# Patient Record
Sex: Female | Born: 1995 | Hispanic: Yes | Marital: Married | Smoking: Never smoker
Health system: Southern US, Community
[De-identification: ages and names within clinical notes are randomized; demographics above are authoritative.]

## PROBLEM LIST (undated history)

## (undated) DIAGNOSIS — Z789 Other specified health status: Secondary | ICD-10-CM

## (undated) HISTORY — DX: Other specified health status: Z78.9

---

## 2017-12-17 DIAGNOSIS — Z118 Encounter for screening for other infectious and parasitic diseases: Secondary | ICD-10-CM | POA: Diagnosis not present

## 2017-12-17 DIAGNOSIS — F418 Other specified anxiety disorders: Secondary | ICD-10-CM | POA: Diagnosis not present

## 2017-12-17 DIAGNOSIS — Z3200 Encounter for pregnancy test, result unknown: Secondary | ICD-10-CM | POA: Diagnosis not present

## 2017-12-17 DIAGNOSIS — N760 Acute vaginitis: Secondary | ICD-10-CM | POA: Diagnosis not present

## 2017-12-19 ENCOUNTER — Emergency Department (HOSPITAL_COMMUNITY)
Admission: EM | Admit: 2017-12-19 | Discharge: 2017-12-20 | Disposition: A | Discharge status: Home / Self Care | Payer: BLUE CROSS/BLUE SHIELD | Attending: Emergency Medicine | Admitting: Emergency Medicine

## 2017-12-19 ENCOUNTER — Other Ambulatory Visit: Payer: Self-pay

## 2017-12-19 ENCOUNTER — Encounter (HOSPITAL_COMMUNITY): Payer: Self-pay | Admitting: *Deleted

## 2017-12-19 DIAGNOSIS — Z79899 Other long term (current) drug therapy: Secondary | ICD-10-CM | POA: Diagnosis not present

## 2017-12-19 DIAGNOSIS — R51 Headache: Secondary | ICD-10-CM | POA: Insufficient documentation

## 2017-12-19 DIAGNOSIS — R1110 Vomiting, unspecified: Secondary | ICD-10-CM | POA: Diagnosis present

## 2017-12-19 DIAGNOSIS — R519 Headache, unspecified: Secondary | ICD-10-CM

## 2017-12-19 DIAGNOSIS — R112 Nausea with vomiting, unspecified: Secondary | ICD-10-CM

## 2017-12-19 LAB — COMPREHENSIVE METABOLIC PANEL
ALT: 13 U/L (ref 0–44)
AST: 22 U/L (ref 15–41)
Albumin: 4.3 g/dL (ref 3.5–5.0)
Alkaline Phosphatase: 69 U/L (ref 38–126)
Anion gap: 12 (ref 5–15)
BILIRUBIN TOTAL: 0.5 mg/dL (ref 0.3–1.2)
BUN: 11 mg/dL (ref 6–20)
CHLORIDE: 106 mmol/L (ref 98–111)
CO2: 22 mmol/L (ref 22–32)
Calcium: 9.3 mg/dL (ref 8.9–10.3)
Creatinine, Ser: 0.74 mg/dL (ref 0.44–1.00)
Glucose, Bld: 119 mg/dL — ABNORMAL HIGH (ref 70–99)
POTASSIUM: 3 mmol/L — AB (ref 3.5–5.1)
Sodium: 140 mmol/L (ref 135–145)
TOTAL PROTEIN: 7.3 g/dL (ref 6.5–8.1)

## 2017-12-19 LAB — URINALYSIS, ROUTINE W REFLEX MICROSCOPIC
BILIRUBIN URINE: NEGATIVE
Glucose, UA: NEGATIVE mg/dL
KETONES UR: 5 mg/dL — AB
LEUKOCYTES UA: NEGATIVE
NITRITE: NEGATIVE
PROTEIN: NEGATIVE mg/dL
Specific Gravity, Urine: 1.011 (ref 1.005–1.030)
pH: 7 (ref 5.0–8.0)

## 2017-12-19 LAB — CBC
HEMATOCRIT: 39.2 % (ref 36.0–46.0)
Hemoglobin: 13.5 g/dL (ref 12.0–15.0)
MCH: 30.9 pg (ref 26.0–34.0)
MCHC: 34.4 g/dL (ref 30.0–36.0)
MCV: 89.7 fL (ref 78.0–100.0)
PLATELETS: 265 10*3/uL (ref 150–400)
RBC: 4.37 MIL/uL (ref 3.87–5.11)
RDW: 11.8 % (ref 11.5–15.5)
WBC: 9 10*3/uL (ref 4.0–10.5)

## 2017-12-19 LAB — I-STAT BETA HCG BLOOD, ED (MC, WL, AP ONLY): I-stat hCG, quantitative: <5 m[IU]/mL (ref <5)

## 2017-12-19 LAB — LIPASE, BLOOD: LIPASE: 26 U/L (ref 11–51)

## 2017-12-19 MED ORDER — ONDANSETRON 4 MG PO TBDP
4.0000 mg | ORAL_TABLET | Freq: Once | ORAL | Status: AC
  Administered 2017-12-19: 4 mg via ORAL
  Filled 2017-12-19: qty 1

## 2017-12-19 MED ORDER — ONDANSETRON 4 MG PO TBDP: 4.0000 mg | ORAL_TABLET | Freq: Three times a day (TID) | ORAL | 0 refills | Status: DC | PRN

## 2017-12-19 MED ORDER — KETOROLAC TROMETHAMINE 15 MG/ML IJ SOLN
15.0000 mg | Freq: Once | INTRAMUSCULAR | Status: AC
  Administered 2017-12-19: 15 mg via INTRAVENOUS
  Filled 2017-12-19: qty 1

## 2017-12-19 MED ORDER — DEXAMETHASONE SODIUM PHOSPHATE 10 MG/ML IJ SOLN
10.0000 mg | Freq: Once | INTRAMUSCULAR | Status: AC
  Administered 2017-12-19: 10 mg via INTRAVENOUS
  Filled 2017-12-19: qty 1

## 2017-12-19 MED ORDER — DIPHENHYDRAMINE HCL 50 MG/ML IJ SOLN
25.0000 mg | Freq: Once | INTRAMUSCULAR | Status: AC
  Administered 2017-12-19: 25 mg via INTRAVENOUS
  Filled 2017-12-19: qty 1

## 2017-12-19 MED ORDER — SODIUM CHLORIDE 0.9 % IV BOLUS
1000.0000 mL | Freq: Once | INTRAVENOUS | Status: AC
  Administered 2017-12-19: 1000 mL via INTRAVENOUS

## 2017-12-19 MED ORDER — POTASSIUM CHLORIDE CRYS ER 20 MEQ PO TBCR
40.0000 meq | EXTENDED_RELEASE_TABLET | Freq: Once | ORAL | Status: AC
  Administered 2017-12-19: 40 meq via ORAL
  Filled 2017-12-19: qty 2

## 2017-12-19 MED ORDER — PROCHLORPERAZINE EDISYLATE 10 MG/2ML IJ SOLN
10.0000 mg | Freq: Once | INTRAMUSCULAR | Status: AC
  Administered 2017-12-19: 10 mg via INTRAVENOUS
  Filled 2017-12-19: qty 2

## 2017-12-19 NOTE — ED Triage Notes (Signed)
Pt arrives with sudden onset of vomiting about an hour ago, denies abdominal pain. She does have a headache. Reports she is on day 3 of antibiotics for bacterial vaginosis.

## 2017-12-19 NOTE — ED Provider Notes (Signed)
[facility]-EMERGENCY DEPT Provider Note   CSN: [id number] Arrival date & time: 12/19/17  1901     History   Chief Complaint Chief Complaint  Patient presents with  . Emesis    HPI Colleen Phelps is a 22 y.o. female presents today for evaluation of gradual onset, progressively worsening headache at around 3 PM this afternoon.  She states that she woke up from a nap at around 3 PM with throbbing aching frontal headache which has progressively worsened as the day has gone on.  She notes associated photophobia and phonophobia and 2 to 3 hours ago began developing nausea and vomiting.  She states that she has been vomiting at least once every 15 minutes for the past few hours.  Emesis is nonbloody and nonbilious.  She denies abdominal pain, diarrhea, constipation, melena, hematochezia, or urinary symptoms.  Currently on day 3 of Flagyl for BV, has taken 5 doses.  Denies drinking alcohol.  No suspicious food intake.  Denies fevers, chest pain, shortness of breath, vision changes, numbness, weakness, slurred speech, or syncope.  She states this feels similar to migraines that she has had in the past although those typically are not associated with vomiting.  No recent trauma or falls.  The history is provided by the patient.    History reviewed. No pertinent past medical history.  There are no active problems to display for this patient.   History reviewed. No pertinent surgical history.   OB History   None      Home Medications    Prior to Admission medications   Medication Sig Start Date End Date Taking? Authorizing Provider  metroNIDAZOLE (FLAGYL) 500 MG tablet Take 500 mg by mouth 2 (two) times daily.   Yes [provider]  sertraline (ZOLOFT) 25 MG tablet Take 25 mg by mouth daily.   Yes [provider]  ondansetron (ZOFRAN ODT) 4 MG disintegrating tablet Take 1 tablet (4 mg total) by mouth every 8 (eight) hours as needed for nausea or  vomiting. 12/19/17   Jeanie Sewer, PA-C    Family History No family history on file.  Social History Social History   Tobacco Use  . Smoking status: Never Smoker  . Smokeless tobacco: Never Used  Substance Use Topics  . Alcohol use: Yes  . Drug use: Never     Allergies   Patient has no known allergies.   Review of Systems Review of Systems  Constitutional: Negative for chills and fever.  Eyes: Positive for photophobia. Negative for visual disturbance.  Respiratory: Negative for shortness of breath.   Cardiovascular: Negative for chest pain.  Gastrointestinal: Positive for nausea and vomiting. Negative for abdominal pain, constipation and diarrhea.  Genitourinary: Negative for vaginal bleeding, vaginal discharge and vaginal pain.  Neurological: Positive for headaches. Negative for syncope, weakness and numbness.  All other systems reviewed and are negative.    Physical Exam Updated Vital Signs BP 121/74 (BP Location: Left Arm)   Pulse 68   Temp 98 F (36.7 C) (Oral)   Resp 16   Ht 5\' 3"  (1.6 m)   Wt 59.4 kg   LMP 12/16/2017   SpO2 100%   BMI 23.21 kg/m   Physical Exam  Constitutional: She is oriented to person, place, and time. She appears well-developed and well-nourished. No distress.  HENT:  Head: Normocephalic and atraumatic.  Eyes: Conjunctivae are normal. Right eye exhibits no discharge. Left eye exhibits no discharge.  Neck: Normal range of motion. Neck  supple. No JVD present. No tracheal deviation present.  Cardiovascular: Normal rate, regular rhythm, normal heart sounds and intact distal pulses.  Pulmonary/Chest: Effort normal.  Abdominal: Soft. Bowel sounds are normal. She exhibits no distension. There is no tenderness. There is no guarding.  Musculoskeletal: She exhibits no edema.  Neurological: She is alert and oriented to person, place, and time. No cranial nerve deficit or sensory deficit. She exhibits normal muscle tone.  Mental Status:    Alert, thought content appropriate, able to give a coherent history. Speech fluent without evidence of aphasia. Able to follow 2 step commands without difficulty.  Cranial Nerves:  II:  Peripheral visual fields grossly normal, pupils equal, round, reactive to light III,IV, VI: ptosis not present, extra-ocular motions intact bilaterally  V,VII: smile symmetric, facial light touch sensation equal VIII: hearing grossly normal to voice  X: uvula elevates symmetrically  XI: bilateral shoulder shrug symmetric and strong XII: midline tongue extension without fassiculations Motor:  Normal tone. 5/5 strength of BUE and BLE major muscle groups including strong and equal grip strength and dorsiflexion/plantar flexion Sensory: light touch normal in all extremities. Cerebellar: normal finger-to-nose with bilateral upper extremities Gait: normal gait and balance. Able to walk on toes and heels with ease.     Skin: Skin is warm and dry. No erythema.  Psychiatric: She has a normal mood and affect. Her behavior is normal.  Nursing note and vitals reviewed.    ED Treatments / Results  Labs (all labs ordered are listed, but only abnormal results are displayed) Labs Reviewed  COMPREHENSIVE METABOLIC PANEL - Abnormal; Notable for the following components:      Result Value   Potassium 3.0 (*)    Glucose, Bld 119 (*)    All other components within normal limits  URINALYSIS, ROUTINE W REFLEX MICROSCOPIC - Abnormal; Notable for the following components:   Hgb urine dipstick MODERATE (*)    Ketones, ur 5 (*)    Bacteria, UA RARE (*)    All other components within normal limits  LIPASE, BLOOD  CBC  I-STAT BETA HCG BLOOD, ED ([facility], [facility], [facility] ONLY)    EKG None  Radiology No results found.  Procedures Procedures (including critical care time)  Medications Ordered in ED Medications  sodium chloride 0.9 % bolus 1,000 mL (0 mLs Intravenous Stopped 12/19/17 2324)  ondansetron (ZOFRAN-ODT)  disintegrating tablet 4 mg (4 mg Oral Given 12/19/17 2138)  ketorolac (TORADOL) 15 MG/ML injection 15 mg (15 mg Intravenous Given 12/19/17 2240)  prochlorperazine (COMPAZINE) injection 10 mg (10 mg Intravenous Given 12/19/17 2240)  dexamethasone (DECADRON) injection 10 mg (10 mg Intravenous Given 12/19/17 2240)  diphenhydrAMINE (BENADRYL) injection 25 mg (25 mg Intravenous Given 12/19/17 2240)  potassium chloride SA (K-DUR,KLOR-CON) CR tablet 40 mEq (40 mEq Oral Given 12/19/17 2329)     Initial Impression / Assessment and Plan / ED Course  I have reviewed the triage vital signs and the nursing notes.  Pertinent labs & imaging results that were available during my care of the patient were reviewed by me and considered in my medical decision making (see chart for details).     Patient presents for evaluation of gradual onset of frontal headache with associated nausea and vomiting.  Does have a history of migraines and states this feels similar although the vomiting is new.  She has been on Flagyl for 3 days but denies drinking any alcohol.  No focal neurologic deficits, normal neuro exam.  No red flag signs concerning for  SAH, ICH, CVA, or other acute life-threatening intracranial abnormality.  Abdomen is soft and nontender.  Lab work reviewed by me shows hypokalemia likely secondary to persistent vomiting.  Will replenish orally.  UA is not concerning for UTI or nephrolithiasis.  Remainder of lab work is unremarkable.  Doubt obstruction, perforation, appendicitis, colitis, TOA, ectopic pregnancy, PID, ovarian torsion, or other acute surgical abdominal pathology.  We will give migraine cocktail and reassess.  On reevaluation, patient states she is feeling much better.  Rates pain as a 4/10 in severity compared to 8/10 in severity on initial presentation.  Serial abdominal examinations remain benign and she is tolerating p.o. fluids in the ED without difficulty.  She feels comfortable with discharge home.   Recommend follow-up with PCP for reevaluation.  Discussed strict ED return precautions.  Will discharge with Zofran.  Patient and patient's husband verbalized understanding of and agreement with plan and patient is stable for discharge home at this time.  Final Clinical Impressions(s) / ED Diagnoses   Final diagnoses:  Bad headache  Non-intractable vomiting with nausea, unspecified vomiting type    ED Discharge Orders         Ordered    ondansetron (ZOFRAN ODT) 4 MG disintegrating tablet  Every 8 hours PRN     12/19/17 2357           Jeanie Sewer, PA-C 12/21/17 0143    Benjiman Core, MD 12/21/17 2316

## 2017-12-19 NOTE — Discharge Instructions (Addendum)
Alternate 600 mg of ibuprofen and [phone] mg of Tylenol every 3 hours as needed for pain. Do not exceed 4000 mg of Tylenol daily.  Take ibuprofen with food to avoid upset stomach issues.  Drink plenty of water.  Take Zofran as needed for nausea.  Wait around 20 to 30 minutes before having anything to eat or drink to give this medicine time to work.  This medicine will dissolve under your tongue.  Stop taking the Flagyl if your symptoms persist.  Do not drink any alcohol while taking Flagyl.  Follow-up with your PCP or OB/GYN for reevaluation.  Return to the emergency department if any concerning signs or symptoms develop such as fever, worsening headache, persistent vomiting, weakness especially to one side of the body, slurred speech, or passing out.

## 2018-01-27 DIAGNOSIS — Z3201 Encounter for pregnancy test, result positive: Secondary | ICD-10-CM | POA: Diagnosis not present

## 2018-02-09 DIAGNOSIS — Z3201 Encounter for pregnancy test, result positive: Secondary | ICD-10-CM | POA: Diagnosis not present

## 2018-02-09 DIAGNOSIS — B373 Candidiasis of vulva and vagina: Secondary | ICD-10-CM | POA: Diagnosis not present

## 2018-03-25 DIAGNOSIS — Z6824 Body mass index (BMI) 24.0-24.9, adult: Secondary | ICD-10-CM | POA: Diagnosis not present

## 2018-03-25 DIAGNOSIS — R87610 Atypical squamous cells of undetermined significance on cytologic smear of cervix (ASC-US): Secondary | ICD-10-CM | POA: Diagnosis not present

## 2018-03-25 DIAGNOSIS — Z3480 Encounter for supervision of other normal pregnancy, unspecified trimester: Secondary | ICD-10-CM | POA: Diagnosis not present

## 2018-03-25 DIAGNOSIS — Z124 Encounter for screening for malignant neoplasm of cervix: Secondary | ICD-10-CM | POA: Diagnosis not present

## 2018-03-25 DIAGNOSIS — Z113 Encounter for screening for infections with a predominantly sexual mode of transmission: Secondary | ICD-10-CM | POA: Diagnosis not present

## 2018-03-25 DIAGNOSIS — O26849 Uterine size-date discrepancy, unspecified trimester: Secondary | ICD-10-CM | POA: Diagnosis not present

## 2018-03-25 DIAGNOSIS — Z01419 Encounter for gynecological examination (general) (routine) without abnormal findings: Secondary | ICD-10-CM | POA: Diagnosis not present

## 2018-03-25 LAB — OB RESULTS CONSOLE GC/CHLAMYDIA: Gonorrhea: NEGATIVE

## 2018-03-25 LAB — OB RESULTS CONSOLE HIV ANTIBODY (ROUTINE TESTING): HIV: NONREACTIVE

## 2018-03-25 LAB — OB RESULTS CONSOLE RUBELLA ANTIBODY, IGM: Rubella: IMMUNE

## 2018-03-25 LAB — OB RESULTS CONSOLE HEPATITIS B SURFACE ANTIGEN: Hepatitis B Surface Ag: NEGATIVE

## 2018-04-02 LAB — OB RESULTS CONSOLE GC/CHLAMYDIA: Chlamydia: NEGATIVE

## 2018-04-14 DIAGNOSIS — Z369 Encounter for antenatal screening, unspecified: Secondary | ICD-10-CM | POA: Diagnosis not present

## 2018-04-15 ENCOUNTER — Ambulatory Visit (HOSPITAL_COMMUNITY)
Admission: EM | Admit: 2018-04-15 | Discharge: 2018-04-15 | Disposition: A | Discharge status: Home / Self Care | Payer: BLUE CROSS/BLUE SHIELD | Attending: Family Medicine | Admitting: Family Medicine

## 2018-04-15 ENCOUNTER — Encounter (HOSPITAL_COMMUNITY): Payer: Self-pay

## 2018-04-15 DIAGNOSIS — R6889 Other general symptoms and signs: Secondary | ICD-10-CM | POA: Diagnosis not present

## 2018-04-15 MED ORDER — OSELTAMIVIR PHOSPHATE 75 MG PO CAPS: 75.0000 mg | ORAL_CAPSULE | Freq: Two times a day (BID) | ORAL | 0 refills | Status: DC

## 2018-04-15 NOTE — ED Triage Notes (Signed)
Pt presents with flu like symptoms; vomiting, generalized body aches, headache, and sore throat.

## 2018-04-15 NOTE — ED Provider Notes (Signed)
[facility]   [id number] 04/15/18 Arrival Time: 1316   CC: Flu like symptoms  SUBJECTIVE: History from: patient.  Colleen Phelps is a 23 y.o. female who presents with abrupt onset of nasal congestion, runny nose, sore throat, body aches, and vomiting x 1 episodes that began yesterday evening.  Admits to possible sick exposure.  Has tried OTC tylenol with minimal relief.  Symptoms are made worse at night.  Reports previous symptoms in the past.   Denies fever, chills, SOB, wheezing, chest pain, changes in bowel or bladder habits.    Received flu shot this year: yes.  ROS: As per HPI.  History reviewed. No pertinent past medical history. History reviewed. No pertinent surgical history. No Known Allergies No current facility-administered medications on file prior to encounter.    Current Outpatient Medications on File Prior to Encounter  Medication Sig Dispense Refill  . sertraline (ZOLOFT) 25 MG tablet Take 25 mg by mouth daily.     Social History   Socioeconomic History  . Marital status: Married    Spouse name: Not on file  . Number of children: Not on file  . Years of education: Not on file  . Highest education level: Not on file  Occupational History  . Not on file  Social Needs  . Financial resource strain: Not on file  . Food insecurity:    Worry: Not on file    Inability: Not on file  . Transportation needs:    Medical: Not on file    Non-medical: Not on file  Tobacco Use  . Smoking status: Never Smoker  . Smokeless tobacco: Never Used  Substance and Sexual Activity  . Alcohol use: Yes  . Drug use: Never  . Sexual activity: Not on file  Lifestyle  . Physical activity:    Days per week: Not on file    Minutes per session: Not on file  . Stress: Not on file  Relationships  . Social connections:    Talks on phone: Not on file    Gets together: Not on file    Attends religious service: Not on file    Active member of club or organization: Not  on file    Attends meetings of clubs or organizations: Not on file    Relationship status: Not on file  . Intimate partner violence:    Fear of current or ex partner: Not on file    Emotionally abused: Not on file    Physically abused: Not on file    Forced sexual activity: Not on file  Other Topics Concern  . Not on file  Social History Narrative  . Not on file   History reviewed. No pertinent family history.  OBJECTIVE:  Vitals:   04/15/18 1423  BP: 115/63  Pulse: 100  Resp: 20  Temp: 99 F (37.2 C)  TempSrc: Oral  SpO2: 100%     General appearance: alert; appears fatigued, but nontoxic; speaking in full sentences and tolerating own secretions HEENT: NCAT; Ears: EACs clear, TMs pearly gray; Eyes: PERRL.  EOM grossly intact. Nose: nares patent with minimal clear rhinorrhea, turbinates erythematous and swollen; Throat: oropharynx clear, tonsils non erythematous or enlarged, uvula midline  Neck: supple without LAD Lungs: unlabored respirations, symmetrical air entry; cough: absent; no respiratory distress; CTAB Heart: regular rate and rhythm.  Radial pulses 2+ symmetrical bilaterally Skin: warm and dry Psychological: alert and cooperative; normal mood and affect  ASSESSMENT & PLAN:  1. Flu-like symptoms  Meds ordered this encounter  Medications  . oseltamivir (TAMIFLU) 75 MG capsule    Sig: Take 1 capsule (75 mg total) by mouth every 12 (twelve) hours.    Dispense:  10 capsule    Refill:  0    Order Specific Question:   Supervising Provider    Answer:   Eustace Moore [[id number]]    Get plenty of rest and push fluids Tamiflu prescribed.  Take as directed and to completion Use OTC medications like tylenol as needed fever or pain Follow up with PCP if symptoms persist Return or go to ER if you have any new or worsening symptoms fever, chills, nausea, vomiting, chest pain, cough, shortness of breath, wheezing, abdominal pain, changes in bowel or bladder habits,  etc...  Reviewed expectations re: course of current medical issues. Questions answered. Outlined signs and symptoms indicating need for more acute intervention. Patient verbalized understanding. After Visit Summary given.         Rennis Harding, PA-C 04/15/18 [phone]

## 2018-04-15 NOTE — Discharge Instructions (Signed)
Get plenty of rest and push fluids Tamiflu prescribed.  Take as directed and to completion Use OTC medications like tylenol as needed fever or pain Follow up with PCP if symptoms persist Return or go to ER if you have any new or worsening symptoms fever, chills, nausea, vomiting, chest pain, cough, shortness of breath, wheezing, abdominal pain, changes in bowel or bladder habits, etc..Marland Kitchen

## 2018-05-15 DIAGNOSIS — Z363 Encounter for antenatal screening for malformations: Secondary | ICD-10-CM | POA: Diagnosis not present

## 2018-06-10 DIAGNOSIS — Z369 Encounter for antenatal screening, unspecified: Secondary | ICD-10-CM | POA: Diagnosis not present

## 2018-07-10 DIAGNOSIS — Z3480 Encounter for supervision of other normal pregnancy, unspecified trimester: Secondary | ICD-10-CM | POA: Diagnosis not present

## 2018-07-10 DIAGNOSIS — Z23 Encounter for immunization: Secondary | ICD-10-CM | POA: Diagnosis not present

## 2018-08-05 DIAGNOSIS — Z369 Encounter for antenatal screening, unspecified: Secondary | ICD-10-CM | POA: Diagnosis not present

## 2018-08-25 DIAGNOSIS — Z369 Encounter for antenatal screening, unspecified: Secondary | ICD-10-CM | POA: Diagnosis not present

## 2018-09-09 DIAGNOSIS — Z113 Encounter for screening for infections with a predominantly sexual mode of transmission: Secondary | ICD-10-CM | POA: Diagnosis not present

## 2018-09-09 DIAGNOSIS — Z369 Encounter for antenatal screening, unspecified: Secondary | ICD-10-CM | POA: Diagnosis not present

## 2018-09-09 DIAGNOSIS — Z3480 Encounter for supervision of other normal pregnancy, unspecified trimester: Secondary | ICD-10-CM | POA: Diagnosis not present

## 2018-09-09 LAB — OB RESULTS CONSOLE GBS: GBS: NEGATIVE

## 2018-09-15 DIAGNOSIS — Z369 Encounter for antenatal screening, unspecified: Secondary | ICD-10-CM | POA: Diagnosis not present

## 2018-09-23 DIAGNOSIS — Z369 Encounter for antenatal screening, unspecified: Secondary | ICD-10-CM | POA: Diagnosis not present

## 2018-09-30 DIAGNOSIS — Z369 Encounter for antenatal screening, unspecified: Secondary | ICD-10-CM | POA: Diagnosis not present

## 2018-10-06 ENCOUNTER — Encounter (HOSPITAL_COMMUNITY): Payer: Self-pay | Admitting: *Deleted

## 2018-10-06 ENCOUNTER — Other Ambulatory Visit: Payer: Self-pay

## 2018-10-06 ENCOUNTER — Inpatient Hospital Stay (HOSPITAL_COMMUNITY)
Admission: AD | Admit: 2018-10-06 | Discharge: 2018-10-11 | DRG: 788 | Disposition: A | Discharge status: Home / Self Care | Payer: BC Managed Care – PPO | Attending: Obstetrics and Gynecology | Admitting: Obstetrics and Gynecology

## 2018-10-06 DIAGNOSIS — E669 Obesity, unspecified: Secondary | ICD-10-CM | POA: Diagnosis present

## 2018-10-06 DIAGNOSIS — O328XX0 Maternal care for other malpresentation of fetus, not applicable or unspecified: Secondary | ICD-10-CM | POA: Diagnosis present

## 2018-10-06 DIAGNOSIS — O9902 Anemia complicating childbirth: Secondary | ICD-10-CM | POA: Diagnosis present

## 2018-10-06 DIAGNOSIS — D649 Anemia, unspecified: Secondary | ICD-10-CM | POA: Diagnosis present

## 2018-10-06 DIAGNOSIS — Z1159 Encounter for screening for other viral diseases: Secondary | ICD-10-CM

## 2018-10-06 DIAGNOSIS — Z3A40 40 weeks gestation of pregnancy: Secondary | ICD-10-CM | POA: Diagnosis not present

## 2018-10-06 DIAGNOSIS — O99214 Obesity complicating childbirth: Secondary | ICD-10-CM | POA: Diagnosis present

## 2018-10-06 DIAGNOSIS — O26893 Other specified pregnancy related conditions, third trimester: Secondary | ICD-10-CM | POA: Diagnosis present

## 2018-10-06 LAB — TYPE AND SCREEN
ABO/RH(D): O POS
Antibody Screen: NEGATIVE

## 2018-10-06 LAB — CBC
HCT: 35.2 % — ABNORMAL LOW (ref 36.0–46.0)
Hemoglobin: 11.7 g/dL — ABNORMAL LOW (ref 12.0–15.0)
MCH: 30.5 pg (ref 26.0–34.0)
MCHC: 33.2 g/dL (ref 30.0–36.0)
MCV: 91.7 fL (ref 80.0–100.0)
Platelets: 204 10*3/uL (ref 150–400)
RBC: 3.84 MIL/uL — ABNORMAL LOW (ref 3.87–5.11)
RDW: 13.6 % (ref 11.5–15.5)
WBC: 10.3 10*3/uL (ref 4.0–10.5)
nRBC: 0 % (ref 0.0–0.2)

## 2018-10-06 LAB — ABO/RH: ABO/RH(D): O POS

## 2018-10-06 LAB — SARS CORONAVIRUS 2 BY RT PCR (HOSPITAL ORDER, PERFORMED IN ~~LOC~~ HOSPITAL LAB): SARS Coronavirus 2: NEGATIVE

## 2018-10-06 MED ORDER — EPHEDRINE 5 MG/ML INJ: 10.0000 mg | INTRAVENOUS | Status: DC | PRN

## 2018-10-06 MED ORDER — OXYTOCIN BOLUS FROM INFUSION: 500.0000 mL | Freq: Once | INTRAVENOUS | Status: DC

## 2018-10-06 MED ORDER — PHENYLEPHRINE 40 MCG/ML (10ML) SYRINGE FOR IV PUSH (FOR BLOOD PRESSURE SUPPORT): 80.0000 ug | PREFILLED_SYRINGE | INTRAVENOUS | Status: DC | PRN

## 2018-10-06 MED ORDER — LIDOCAINE HCL (PF) 1 % IJ SOLN: 30.0000 mL | INTRAMUSCULAR | Status: DC | PRN

## 2018-10-06 MED ORDER — LACTATED RINGERS IV SOLN
500.0000 mL | Freq: Once | INTRAVENOUS | Status: AC
  Administered 2018-10-07: 500 mL via INTRAVENOUS

## 2018-10-06 MED ORDER — TERBUTALINE SULFATE 1 MG/ML IJ SOLN: 0.2500 mg | Freq: Once | INTRAMUSCULAR | Status: DC | PRN

## 2018-10-06 MED ORDER — OXYTOCIN 40 UNITS IN NORMAL SALINE INFUSION - SIMPLE MED
1.0000 m[IU]/min | INTRAVENOUS | Status: DC
  Administered 2018-10-06: 2 m[IU]/min via INTRAVENOUS
  Administered 2018-10-07: 10:00:00 8 m[IU]/min via INTRAVENOUS
  Administered 2018-10-07: 09:00:00 6 m[IU]/min via INTRAVENOUS

## 2018-10-06 MED ORDER — PHENYLEPHRINE 40 MCG/ML (10ML) SYRINGE FOR IV PUSH (FOR BLOOD PRESSURE SUPPORT)
80.0000 ug | PREFILLED_SYRINGE | INTRAVENOUS | Status: DC | PRN
  Filled 2018-10-06: qty 10

## 2018-10-06 MED ORDER — OXYCODONE-ACETAMINOPHEN 5-325 MG PO TABS: 2.0000 | ORAL_TABLET | ORAL | Status: DC | PRN

## 2018-10-06 MED ORDER — OXYCODONE-ACETAMINOPHEN 5-325 MG PO TABS: 1.0000 | ORAL_TABLET | ORAL | Status: DC | PRN

## 2018-10-06 MED ORDER — ACETAMINOPHEN 325 MG PO TABS: 650.0000 mg | ORAL_TABLET | ORAL | Status: DC | PRN

## 2018-10-06 MED ORDER — LACTATED RINGERS IV SOLN
500.0000 mL | INTRAVENOUS | Status: DC | PRN
  Administered 2018-10-07: 500 mL via INTRAVENOUS

## 2018-10-06 MED ORDER — OXYTOCIN 40 UNITS IN NORMAL SALINE INFUSION - SIMPLE MED
2.5000 [IU]/h | INTRAVENOUS | Status: DC
  Filled 2018-10-06: qty 1000

## 2018-10-06 MED ORDER — FLEET ENEMA 7-19 GM/118ML RE ENEM: 1.0000 | ENEMA | RECTAL | Status: DC | PRN

## 2018-10-06 MED ORDER — ONDANSETRON HCL 4 MG/2ML IJ SOLN: 4.0000 mg | Freq: Four times a day (QID) | INTRAMUSCULAR | Status: DC | PRN

## 2018-10-06 MED ORDER — DIPHENHYDRAMINE HCL 50 MG/ML IJ SOLN: 12.5000 mg | INTRAMUSCULAR | Status: DC | PRN

## 2018-10-06 MED ORDER — SOD CITRATE-CITRIC ACID 500-334 MG/5ML PO SOLN: 30.0000 mL | ORAL | Status: DC | PRN

## 2018-10-06 MED ORDER — FENTANYL-BUPIVACAINE-NACL 0.5-0.125-0.9 MG/250ML-% EP SOLN
12.0000 mL/h | EPIDURAL | Status: DC | PRN
  Filled 2018-10-06: qty 250

## 2018-10-06 MED ORDER — LACTATED RINGERS IV SOLN
INTRAVENOUS | Status: DC
  Administered 2018-10-06 – 2018-10-07 (×6): via INTRAVENOUS

## 2018-10-06 MED ORDER — FENTANYL CITRATE (PF) 100 MCG/2ML IJ SOLN
100.0000 ug | INTRAMUSCULAR | Status: DC | PRN
  Administered 2018-10-06 – 2018-10-07 (×5): 100 ug via INTRAVENOUS
  Filled 2018-10-06 (×5): qty 2

## 2018-10-06 NOTE — H&P (Signed)
Shawnte Winton is an 23 y.o. G26P0 [redacted]w[redacted]d female who presents to L&D in labor. Her PNC was uncomplicated. GBS-neg/OGTT-wnl/Did not have genetic testing  History reviewed. No pertinent past medical history.  History reviewed. No pertinent surgical history.  History reviewed. No pertinent family history. Social History:  reports that she has never smoked. She has never used smokeless tobacco. She reports current alcohol use. She reports that she does not use drugs.  Allergies: No Known Allergies  Medications Prior to Admission  Medication Sig Dispense Refill  . oseltamivir (TAMIFLU) 75 MG capsule Take 1 capsule (75 mg total) by mouth every 12 (twelve) hours. 10 capsule 0  . sertraline (ZOLOFT) 25 MG tablet Take 25 mg by mouth daily.         Blood pressure 127/63, pulse 89, temperature 98.2 F (36.8 C), temperature source Oral, resp. rate 18, height 5\' 3"  (1.6 m), weight 83.3 kg, last menstrual period 12/16/2017, SpO2 100 %. Abdomen: gravid   Lab Results  Component Value Date   WBC 10.3 10/06/2018   HGB 11.7 (L) 10/06/2018   HCT 35.2 (L) 10/06/2018   MCV 91.7 10/06/2018   PLT 204 10/06/2018   Lab Results  Component Value Date   HCG <5.0 12/19/2017      Patient Active Problem List   Diagnosis Date Noted  . Normal labor and delivery 10/06/2018   IMP/ IUP at term in labor Plan/ Admit  Olga Millers 10/06/2018, 4:27 PM

## 2018-10-06 NOTE — MAU Note (Signed)
Since 0330 this morning, has been having contractions. 5-78min, gotten stronger. No water leaking. Was 2 cm at last appt.  When she wipes, she sees pink d/c.

## 2018-10-07 ENCOUNTER — Inpatient Hospital Stay (HOSPITAL_COMMUNITY): Payer: BC Managed Care – PPO | Admitting: Anesthesiology

## 2018-10-07 ENCOUNTER — Encounter (HOSPITAL_COMMUNITY): Payer: Self-pay | Admitting: Anesthesiology

## 2018-10-07 ENCOUNTER — Encounter (HOSPITAL_COMMUNITY)
Admission: AD | Disposition: A | Discharge status: Home / Self Care | Payer: Self-pay | Source: Home / Self Care | Attending: Obstetrics and Gynecology

## 2018-10-07 LAB — RPR: RPR Ser Ql: NONREACTIVE

## 2018-10-07 SURGERY — Surgical Case
Anesthesia: Epidural | Wound class: Clean Contaminated

## 2018-10-07 MED ORDER — SERTRALINE HCL 25 MG PO TABS
25.0000 mg | ORAL_TABLET | Freq: Every day | ORAL | Status: DC
  Filled 2018-10-07 (×5): qty 1

## 2018-10-07 MED ORDER — OXYTOCIN 40 UNITS IN NORMAL SALINE INFUSION - SIMPLE MED
INTRAVENOUS | Status: AC
  Filled 2018-10-07: qty 1000

## 2018-10-07 MED ORDER — ONDANSETRON HCL 4 MG/2ML IJ SOLN
INTRAMUSCULAR | Status: AC
  Filled 2018-10-07: qty 2

## 2018-10-07 MED ORDER — COCONUT OIL OIL: 1.0000 "application " | TOPICAL_OIL | Status: DC | PRN

## 2018-10-07 MED ORDER — LIDOCAINE HCL (PF) 1 % IJ SOLN
INTRAMUSCULAR | Status: DC | PRN
  Administered 2018-10-07 (×2): 4 mL via EPIDURAL

## 2018-10-07 MED ORDER — SODIUM CHLORIDE 0.9 % IV SOLN
INTRAVENOUS | Status: DC | PRN
  Administered 2018-10-07: 12:00:00 via INTRAVENOUS

## 2018-10-07 MED ORDER — SENNOSIDES-DOCUSATE SODIUM 8.6-50 MG PO TABS
2.0000 | ORAL_TABLET | ORAL | Status: DC
  Administered 2018-10-08 – 2018-10-10 (×4): 2 via ORAL
  Filled 2018-10-07 (×4): qty 2

## 2018-10-07 MED ORDER — WITCH HAZEL-GLYCERIN EX PADS: 1.0000 "application " | MEDICATED_PAD | CUTANEOUS | Status: DC | PRN

## 2018-10-07 MED ORDER — SODIUM BICARBONATE 8.4 % IV SOLN
INTRAVENOUS | Status: DC | PRN
  Administered 2018-10-07: 3 mL via EPIDURAL
  Administered 2018-10-07: 7 mL via EPIDURAL

## 2018-10-07 MED ORDER — DIBUCAINE (PERIANAL) 1 % EX OINT: 1.0000 "application " | TOPICAL_OINTMENT | CUTANEOUS | Status: DC | PRN

## 2018-10-07 MED ORDER — IBUPROFEN 800 MG PO TABS
800.0000 mg | ORAL_TABLET | Freq: Three times a day (TID) | ORAL | Status: AC
  Administered 2018-10-07 – 2018-10-10 (×9): 800 mg via ORAL
  Filled 2018-10-07 (×9): qty 1

## 2018-10-07 MED ORDER — MORPHINE SULFATE (PF) 0.5 MG/ML IJ SOLN
INTRAMUSCULAR | Status: AC
  Filled 2018-10-07: qty 10

## 2018-10-07 MED ORDER — SIMETHICONE 80 MG PO CHEW
80.0000 mg | CHEWABLE_TABLET | ORAL | Status: DC
  Administered 2018-10-08 – 2018-10-10 (×4): 80 mg via ORAL
  Filled 2018-10-07 (×4): qty 1

## 2018-10-07 MED ORDER — DIPHENHYDRAMINE HCL 25 MG PO CAPS: 25.0000 mg | ORAL_CAPSULE | Freq: Four times a day (QID) | ORAL | Status: DC | PRN

## 2018-10-07 MED ORDER — PHENYLEPHRINE 40 MCG/ML (10ML) SYRINGE FOR IV PUSH (FOR BLOOD PRESSURE SUPPORT)
PREFILLED_SYRINGE | INTRAVENOUS | Status: AC
  Filled 2018-10-07: qty 10

## 2018-10-07 MED ORDER — FENTANYL CITRATE (PF) 100 MCG/2ML IJ SOLN
INTRAMUSCULAR | Status: AC
  Filled 2018-10-07: qty 2

## 2018-10-07 MED ORDER — FENTANYL CITRATE (PF) 100 MCG/2ML IJ SOLN: 25.0000 ug | INTRAMUSCULAR | Status: DC | PRN

## 2018-10-07 MED ORDER — ACETAMINOPHEN 325 MG PO TABS
650.0000 mg | ORAL_TABLET | ORAL | Status: DC | PRN
  Administered 2018-10-08 – 2018-10-11 (×7): 650 mg via ORAL
  Filled 2018-10-07 (×7): qty 2

## 2018-10-07 MED ORDER — OXYTOCIN 10 UNIT/ML IJ SOLN
INTRAMUSCULAR | Status: AC
  Filled 2018-10-07: qty 4

## 2018-10-07 MED ORDER — NALOXONE HCL 0.4 MG/ML IJ SOLN: 0.4000 mg | INTRAMUSCULAR | Status: DC | PRN

## 2018-10-07 MED ORDER — MEPERIDINE HCL 25 MG/ML IJ SOLN: 6.2500 mg | INTRAMUSCULAR | Status: DC | PRN

## 2018-10-07 MED ORDER — MORPHINE SULFATE (PF) 0.5 MG/ML IJ SOLN
INTRAMUSCULAR | Status: DC | PRN
  Administered 2018-10-07: 3 mg via EPIDURAL
  Administered 2018-10-07: 2 mg via INTRAVENOUS

## 2018-10-07 MED ORDER — PHENYLEPHRINE HCL (PRESSORS) 10 MG/ML IV SOLN
INTRAVENOUS | Status: DC | PRN
  Administered 2018-10-07 (×2): 40 ug via INTRAVENOUS

## 2018-10-07 MED ORDER — SODIUM CHLORIDE 0.9% FLUSH: 3.0000 mL | INTRAVENOUS | Status: DC | PRN

## 2018-10-07 MED ORDER — CEFAZOLIN SODIUM-DEXTROSE 2-4 GM/100ML-% IV SOLN
2.0000 g | Freq: Once | INTRAVENOUS | Status: AC
  Administered 2018-10-07: 2 g via INTRAVENOUS

## 2018-10-07 MED ORDER — LIDOCAINE-EPINEPHRINE (PF) 2 %-1:200000 IJ SOLN
INTRAMUSCULAR | Status: DC | PRN
  Administered 2018-10-07 (×2): 4 mL via EPIDURAL

## 2018-10-07 MED ORDER — SODIUM CHLORIDE 0.9 % IR SOLN
Status: DC | PRN
  Administered 2018-10-07: 1000 mL

## 2018-10-07 MED ORDER — SODIUM CHLORIDE 0.9 % IV SOLN
INTRAVENOUS | Status: DC | PRN
  Administered 2018-10-07: 40 [IU] via INTRAVENOUS

## 2018-10-07 MED ORDER — PHENYLEPHRINE 40 MCG/ML (10ML) SYRINGE FOR IV PUSH (FOR BLOOD PRESSURE SUPPORT)
80.0000 ug | PREFILLED_SYRINGE | Freq: Once | INTRAVENOUS | Status: AC
  Administered 2018-10-07: 08:00:00 80 ug via INTRAVENOUS

## 2018-10-07 MED ORDER — ZOLPIDEM TARTRATE 5 MG PO TABS: 5.0000 mg | ORAL_TABLET | Freq: Every evening | ORAL | Status: DC | PRN

## 2018-10-07 MED ORDER — ONDANSETRON HCL 4 MG/2ML IJ SOLN
INTRAMUSCULAR | Status: DC | PRN
  Administered 2018-10-07: 4 mg via INTRAVENOUS

## 2018-10-07 MED ORDER — LACTATED RINGERS IV SOLN: INTRAVENOUS | Status: DC

## 2018-10-07 MED ORDER — ONDANSETRON HCL 4 MG/2ML IJ SOLN: 4.0000 mg | Freq: Three times a day (TID) | INTRAMUSCULAR | Status: DC | PRN

## 2018-10-07 MED ORDER — CEFAZOLIN SODIUM-DEXTROSE 2-4 GM/100ML-% IV SOLN
INTRAVENOUS | Status: AC
  Filled 2018-10-07: qty 100

## 2018-10-07 MED ORDER — KETOROLAC TROMETHAMINE 30 MG/ML IJ SOLN
30.0000 mg | Freq: Four times a day (QID) | INTRAMUSCULAR | Status: AC | PRN
  Administered 2018-10-07: 30 mg via INTRAMUSCULAR

## 2018-10-07 MED ORDER — PRENATAL MULTIVITAMIN CH
1.0000 | ORAL_TABLET | Freq: Every day | ORAL | Status: DC
  Administered 2018-10-07 – 2018-10-11 (×5): 1 via ORAL
  Filled 2018-10-07 (×5): qty 1

## 2018-10-07 MED ORDER — FENTANYL CITRATE (PF) 100 MCG/2ML IJ SOLN
INTRAMUSCULAR | Status: DC | PRN
  Administered 2018-10-07: 100 ug via INTRAVENOUS

## 2018-10-07 MED ORDER — STERILE WATER FOR IRRIGATION IR SOLN
Status: DC | PRN
  Administered 2018-10-07: 1000 mL

## 2018-10-07 MED ORDER — SIMETHICONE 80 MG PO CHEW: 80.0000 mg | CHEWABLE_TABLET | ORAL | Status: DC | PRN

## 2018-10-07 MED ORDER — TETANUS-DIPHTH-ACELL PERTUSSIS 5-2.5-18.5 LF-MCG/0.5 IM SUSP: 0.5000 mL | Freq: Once | INTRAMUSCULAR | Status: DC

## 2018-10-07 MED ORDER — OXYCODONE-ACETAMINOPHEN 5-325 MG PO TABS: 1.0000 | ORAL_TABLET | ORAL | Status: DC | PRN

## 2018-10-07 MED ORDER — OXYTOCIN 40 UNITS IN NORMAL SALINE INFUSION - SIMPLE MED: 2.5000 [IU]/h | INTRAVENOUS | Status: AC

## 2018-10-07 MED ORDER — SODIUM BICARBONATE 8.4 % IV SOLN
INTRAVENOUS | Status: AC
  Filled 2018-10-07: qty 50

## 2018-10-07 MED ORDER — SIMETHICONE 80 MG PO CHEW
80.0000 mg | CHEWABLE_TABLET | Freq: Three times a day (TID) | ORAL | Status: DC
  Administered 2018-10-07 – 2018-10-11 (×12): 80 mg via ORAL
  Filled 2018-10-07 (×13): qty 1

## 2018-10-07 MED ORDER — KETOROLAC TROMETHAMINE 30 MG/ML IJ SOLN: 30.0000 mg | Freq: Four times a day (QID) | INTRAMUSCULAR | Status: AC | PRN

## 2018-10-07 MED ORDER — SODIUM CHLORIDE (PF) 0.9 % IJ SOLN
INTRAMUSCULAR | Status: DC | PRN
  Administered 2018-10-07: 12 mL/h via EPIDURAL

## 2018-10-07 MED ORDER — KETOROLAC TROMETHAMINE 30 MG/ML IJ SOLN
INTRAMUSCULAR | Status: AC
  Filled 2018-10-07: qty 1

## 2018-10-07 MED ORDER — LACTATED RINGERS IV SOLN: 500.0000 mL | Freq: Once | INTRAVENOUS | Status: DC

## 2018-10-07 MED ORDER — MENTHOL 3 MG MT LOZG: 1.0000 | LOZENGE | OROMUCOSAL | Status: DC | PRN

## 2018-10-07 SURGICAL SUPPLY — 33 items
BENZOIN TINCTURE PRP APPL 2/3 (GAUZE/BANDAGES/DRESSINGS) ×2 IMPLANT
CHLORAPREP W/TINT 26ML (MISCELLANEOUS) ×2 IMPLANT
CLAMP CORD UMBIL (MISCELLANEOUS) IMPLANT
CLOSURE STERI STRIP 1/2 X4 (GAUZE/BANDAGES/DRESSINGS) ×2 IMPLANT
CLOTH BEACON ORANGE TIMEOUT ST (SAFETY) ×2 IMPLANT
DRSG OPSITE POSTOP 4X10 (GAUZE/BANDAGES/DRESSINGS) ×2 IMPLANT
ELECT REM PT RETURN 9FT ADLT (ELECTROSURGICAL) ×2
ELECTRODE REM PT RTRN 9FT ADLT (ELECTROSURGICAL) ×1 IMPLANT
EXTRACTOR VACUUM M CUP 4 TUBE (SUCTIONS) IMPLANT
GLOVE BIOGEL PI IND STRL 6.5 (GLOVE) ×1 IMPLANT
GLOVE BIOGEL PI IND STRL 7.0 (GLOVE) ×1 IMPLANT
GLOVE BIOGEL PI INDICATOR 6.5 (GLOVE) ×1
GLOVE BIOGEL PI INDICATOR 7.0 (GLOVE) ×1
GLOVE ECLIPSE 6.5 STRL STRAW (GLOVE) ×2 IMPLANT
GOWN STRL REUS W/TWL LRG LVL3 (GOWN DISPOSABLE) ×4 IMPLANT
KIT ABG SYR 3ML LUER SLIP (SYRINGE) IMPLANT
NEEDLE HYPO 25X5/8 SAFETYGLIDE (NEEDLE) IMPLANT
NS IRRIG 1000ML POUR BTL (IV SOLUTION) ×2 IMPLANT
PACK C SECTION WH (CUSTOM PROCEDURE TRAY) ×2 IMPLANT
PAD ABD 7.5X8 STRL (GAUZE/BANDAGES/DRESSINGS) IMPLANT
PAD OB MATERNITY 4.3X12.25 (PERSONAL CARE ITEMS) ×2 IMPLANT
PENCIL SMOKE EVAC W/HOLSTER (ELECTROSURGICAL) ×2 IMPLANT
RTRCTR C-SECT PINK 25CM LRG (MISCELLANEOUS) ×2 IMPLANT
SUT MON AB 2-0 CT1 27 (SUTURE) ×2 IMPLANT
SUT MON AB 4-0 PS1 27 (SUTURE) IMPLANT
SUT PDS AB 0 CTX 60 (SUTURE) IMPLANT
SUT PLAIN 2 0 XLH (SUTURE) ×2 IMPLANT
SUT VIC AB 0 CTX 36 (SUTURE) ×4
SUT VIC AB 0 CTX36XBRD ANBCTRL (SUTURE) ×4 IMPLANT
SUT VIC AB 4-0 KS 27 (SUTURE) ×2 IMPLANT
TOWEL OR 17X24 6PK STRL BLUE (TOWEL DISPOSABLE) ×2 IMPLANT
TRAY FOLEY W/BAG SLVR 14FR LF (SET/KITS/TRAYS/PACK) ×2 IMPLANT
WATER STERILE IRR 1000ML POUR (IV SOLUTION) ×2 IMPLANT

## 2018-10-07 NOTE — Progress Notes (Signed)
Pt progressed to 7cm then had a protracted labor curve. Now with an anterior lip. Nursing service is concerned that baby is OP. Pt just received an epidural. Once pt has adequate pain relief will check and if OP consider manual rotation

## 2018-10-07 NOTE — Op Note (Signed)
NAME: Colleen Phelps, Colleen MEDICAL RECORD [id number] ACCOUNT [id number] DATE OF BIRTH:Jul 02, 1995 FACILITY: [facility] LOCATION: [facility] PHYSICIAN:Taelor Waymire Adah PerlM. Josephyne Tarter, DO  OPERATIVE REPORT  DATE OF PROCEDURE:  10/07/2018  PREOPERATIVE DIAGNOSIS:  Primary cesarean section, arrest of descent, suspected occiput posterior presentation, late decelerations.  POSTOPERATIVE DIAGNOSES:  Primary cesarean section, arrest of descent, confirmed occiput posterior presentation.  PROCEDURE:  Low transverse cesarean section.  SURGEON:  Philip AspenSidney Falecia Vannatter, DO  ANESTHESIA:  Epidural.  ESTIMATED BLOOD LOSS:  556 mL.  FINDINGS:  Female infant in OP presentation with asynclitism.  Apgars and weight not assigned at time of brief op note.  Normal bilateral ovaries and tubes.  SPECIMENS:  Cord blood attempted collection.  Also attempted collection for cord pH.  Unable to draw.  COMPLICATIONS:  None.  CONDITION:  Stable to PACU.  DESCRIPTION OF PROCEDURE:  The patient was taken to the operating room where epidural anesthesia was bolused and found to be adequate.  She was prepped and draped in the normal sterile fashion in dorsal supine position with a leftward tilt.  A scalpel  was used to make a Pfannenstiel skin incision which was carried down to underlying layer of fascia with Bovie cautery.  The scalpel was used to incise the fascia at the midline and extended laterally with Mayo scissors.  Kocher clamps were placed at the  superior aspect of the fascial incision.  Rectus muscles were dissected off bluntly and sharply.  Kocher clamps were then placed at the inferior aspect of the fascial incision.  Rectus muscles were dissected off bluntly and sharply.  Hemostat was used to  separate the rectus muscle superiorly, and with good visualization the peritoneum was entered bluntly.  Peritoneal incision was extended with Metzenbaum scissors laterally and further with gentle traction.  The abdomen and pelvis were surveyed  manually.   An PharmacologistAlexis self-retractor was placed.  The vesicouterine peritoneum was identified, tented, and entered sharply with Metzenbaum scissors.  The bladder flap was extended laterally with Metzenbaums, and the bladder flap developed digitally.  A scalpel was  used to make a low transverse cesarean incision, and the amniotic sac was entered sharply with the scalpel and meconium fluid noted.  The infant's head was located, flexed, and delivered without difficulty followed by the remainder of the infant's  shoulders and body.  The infant was bulb suctioned and dried while delayed cord clamping was performed at 1 minute.  The cord was clamped and cut, and the infant was handed off to awaiting neonatology.  Cord blood was collected.  External massage of the  uterus was performed to increase tone, and gentle traction placed on the umbilical cord to facilitate delivery of the placenta.  The uterus was then cleared of all clot and debris, and the uterine incision was closed with 0 Vicryl in a running locked  fashion followed by a second layer of vertical imbrication.  An additional figure-of-eight was placed at the right angle and medial to the left angle.  Excellent hemostasis was noted.  The Graybar Electriclexis self-retractor was removed, and the incision was  reexamined and still hemostatic.  Both ovaries and tubes were visualized and normal.  The peritoneum was reapproximated and closed with Monocryl in a running fashion followed by a running reapproximation of the lower aspect of the rectus muscles.  The  fascia was then reapproximated and closed with 0 Vicryl in a running fashion.  The subcutaneous tissue was irrigated and dried.  Minimal use of Bovie cautery was needed for hemostasis.  The  skin was then reapproximated and closed with Vicryl on a Keith  needle in a subcuticular fashion.  The patient tolerated the procedure well.  Sponge, lap and needle counts were correct x2.  The patient was taken to recovery in  stable condition.  LN/NUANCE  D:10/07/2018 T:10/07/2018 JOB:[id number]

## 2018-10-07 NOTE — Anesthesia Postprocedure Evaluation (Signed)
Anesthesia Post Note  Patient: Colleen Phelps  Procedure(s) Performed: CESAREAN SECTION (N/A )     Patient location during evaluation: PACU Anesthesia Type: Epidural Level of consciousness: oriented and awake and alert Pain management: pain level controlled Vital Signs Assessment: post-procedure vital signs reviewed and stable Respiratory status: spontaneous breathing, respiratory function stable and nonlabored ventilation Cardiovascular status: blood pressure returned to baseline and stable Postop Assessment: no headache, no backache, no apparent nausea or vomiting, epidural receding and patient able to bend at knees Anesthetic complications: no    Last Vitals:  Vitals:   10/07/18 1330 10/07/18 1345  BP: (!) 103/51 (!) 82/44  Pulse: 80 82  Resp: 15 19  Temp:    SpO2: 99% 100%    Last Pain:  Vitals:   10/07/18 1330  TempSrc:   PainSc: 3    Pain Goal:                Epidural/Spinal Function Cutaneous sensation: Tingles (10/07/18 1345), Patient able to flex knees: Yes (10/07/18 1345), Patient able to lift hips off bed: No (10/07/18 1345), Back pain beyond tenderness at insertion site: No (10/07/18 1345), Progressively worsening motor and/or sensory loss: No (10/07/18 1345), Bowel and/or bladder incontinence post epidural: No (10/07/18 1345)  Guhan Bruington A.

## 2018-10-07 NOTE — Brief Op Note (Signed)
10/06/2018 - 10/07/2018  12:23 PM  PATIENT:  Colleen Phelps  23 y.o. female  PRE-OPERATIVE DIAGNOSIS:  primary C/S; arrest of descent  POST-OPERATIVE DIAGNOSIS:  primary C/S; arrest of descent  PROCEDURE:  Procedure(s): CESAREAN SECTION (N/A)- low transverse  SURGEON:  Surgeon(s) and Role:    Rogue Bussing, Eldean Klatt, DO - Primary  ANESTHESIA:   epidural  EBL:  556 mL   FINDINGS: female infant OP presentation with asynclitism, APGARS and wt not yet assigned.  Normal bilateral ovaries and tubes.  SPECIMEN:  Source of Specimen:  attempted collection of cord blood for pH, unable to draw  DISPOSITION OF SPECIMEN:  N/A  COUNTS:  YES  PLAN OF CARE: Admit to inpatient   PATIENT DISPOSITION:  PACU - hemodynamically stable.   Delay start of Pharmacological VTE agent (>24hrs) due to surgical blood loss or risk of bleeding: not applicable

## 2018-10-07 NOTE — Anesthesia Preprocedure Evaluation (Addendum)
Anesthesia Evaluation  Patient identified by MRN, date of birth, ID band Patient awake    Reviewed: Allergy & Precautions, NPO status , Patient's Chart, lab work & pertinent test results  Airway Mallampati: II  TM Distance: >3 FB Neck ROM: Full    Dental no notable dental hx. (+) Teeth Intact   Pulmonary neg pulmonary ROS,    Pulmonary exam normal breath sounds clear to auscultation       Cardiovascular negative cardio ROS Normal cardiovascular exam Rhythm:Regular Rate:Normal     Neuro/Psych negative neurological ROS  negative psych ROS   GI/Hepatic negative GI ROS, Neg liver ROS,   Endo/Other  Obesity  Renal/GU negative Renal ROS  negative genitourinary   Musculoskeletal negative musculoskeletal ROS (+)   Abdominal (+) + obese,   Peds  Hematology  (+) anemia ,   Anesthesia Other Findings   Reproductive/Obstetrics (+) Pregnancy                            Anesthesia Physical Anesthesia Plan  ASA: II and emergent  Anesthesia Plan: Epidural   Post-op Pain Management:    Induction:   PONV Risk Score and Plan:   Airway Management Planned: Natural Airway  Additional Equipment:   Intra-op Plan:   Post-operative Plan:   Informed Consent: I have reviewed the patients History and Physical, chart, labs and discussed the procedure including the risks, benefits and alternatives for the proposed anesthesia with the patient or authorized representative who has indicated his/her understanding and acceptance.       Plan Discussed with: Anesthesiologist, CRNA and Surgeon  Anesthesia Plan Comments: (Patient for C/Section for arrest of descent. Will use epidural for C/section. M. Royce Macadamia, MD)       Anesthesia Quick Evaluation

## 2018-10-07 NOTE — Transfer of Care (Signed)
Immediate Anesthesia Transfer of Care Note  Patient: Hilde Churchman  Procedure(s) Performed: CESAREAN SECTION (N/A )  Patient Location: PACU  Anesthesia Type:Epidural  Level of Consciousness: awake, alert  and oriented  Airway & Oxygen Therapy: Patient Spontanous Breathing  Post-op Assessment: Report given to RN and Post -op Vital signs reviewed and stable  Post vital signs: Reviewed and stable  Last Vitals:  Vitals Value Taken Time  BP 99/53 10/07/18 1236  Temp    Pulse 88 10/07/18 1242  Resp 24 10/07/18 1242  SpO2 100 % 10/07/18 1242  Vitals shown include unvalidated device data.  Last Pain:  Vitals:   10/07/18 1102  TempSrc: Oral  PainSc:          Complications: No apparent anesthesia complications

## 2018-10-07 NOTE — Progress Notes (Signed)
Pt comfortable with epidural.  Has been pushing 2 hours, I assessed at beginning and now reassessed an minimal progress with fetal head behind pubic bone, good maternal effort, suspect OP or asynclitism.  Good FHT variability but after d/c of pushing late decelerations again noted.  Pt counseled about option of continued pushing and risks associated if needing to cesarean section.  Discussed vacuum assisted delivery, I don't believe she is a good candidate given significant caput and not of sufficient station- currently 0/+1.  Discussed risks, benefits, potential complications and implication for future pregnancies.  I recommend proceeding to cesarean section and pt agrees, consent signed.

## 2018-10-07 NOTE — Progress Notes (Signed)
MOB was referred for history of depression/anxiety. * Referral screened out by Clinical Social Worker because none of the following criteria appear to apply: ~ History of anxiety/depression during this pregnancy, or of post-partum depression following prior delivery. ~ Diagnosis of anxiety and/or depression within last 3 years OR * MOB's symptoms currently being treated with medication and/or therapy. Per MOB's records, MOB on Zoloft for anxiety/depression.    Please contact the Clinical Social Worker if needs arise, by [facility] request, or if MOB scores greater than 9/yes to question 10 on Edinburgh Postpartum Depression Screen.       Virgie Dad Cesareo Vickrey, MSW, LCSW-A [facility] [place] [facility] [place] [phone]

## 2018-10-07 NOTE — Progress Notes (Signed)
Pt comfortable s/p epidural.  RN alerted me to repeat late decels after epidural placed.  Reviewed strip from overnight and there were prior episodes of late decelerations that resolved. Restarted with epidural placement.  BP somewhat low, advised RN to administer phenylephrine and stop pitocin.  Decelerations improved but did not resolved, good variability present and RN able to elicit scalp stim.  I presented to check pt, found to be complete and + scalp stim again. Pitocin restarted at 1/2 last dose. Did trial push and baby tolerated well with no late decel, more of variable appearance with moderate variability and normal baseline.  Pt pushed well and will continue to monitor.

## 2018-10-07 NOTE — Anesthesia Procedure Notes (Signed)
Epidural Patient location during procedure: OB Start time: 10/07/2018 7:06 AM End time: 10/07/2018 7:15 AM  Staffing Anesthesiologist: Josephine Igo, MD Performed: anesthesiologist   Preanesthetic Checklist Completed: patient identified, site marked, surgical consent, pre-op evaluation, timeout performed, IV checked, risks and benefits discussed and monitors and equipment checked  Epidural Patient position: sitting Prep: site prepped and draped and DuraPrep Patient monitoring: continuous pulse ox and blood pressure Approach: midline Location: L3-L4 Injection technique: LOR air  Needle:  Needle type: Tuohy  Needle gauge: 17 G Needle length: 9 cm and 9 Needle insertion depth: 4 cm Catheter type: closed end flexible Catheter size: 19 Gauge Catheter at skin depth: 9 cm Test dose: negative and Other  Assessment Events: blood not aspirated, injection not painful, no injection resistance, negative IV test and no paresthesia  Additional Notes Patient identified. Risks and benefits discussed including failed block, incomplete  Pain control, post dural puncture headache, nerve damage, paralysis, blood pressure Changes, nausea, vomiting, reactions to medications-both toxic and allergic and post Partum back pain. All questions were answered. Patient expressed understanding and wished to proceed. Sterile technique was used throughout procedure. Epidural site was Dressed with sterile barrier dressing. No paresthesias, signs of intravascular injection Or signs of intrathecal spread were encountered.  Patient was more comfortable after the epidural was dosed. Please see RN's note for documentation of vital signs and FHR which are stable. Reason for block:procedure for pain

## 2018-10-08 LAB — CBC
HCT: 24.9 % — ABNORMAL LOW (ref 36.0–46.0)
Hemoglobin: 8.3 g/dL — ABNORMAL LOW (ref 12.0–15.0)
MCH: 30.7 pg (ref 26.0–34.0)
MCHC: 33.3 g/dL (ref 30.0–36.0)
MCV: 92.2 fL (ref 80.0–100.0)
Platelets: 163 10*3/uL (ref 150–400)
RBC: 2.7 MIL/uL — ABNORMAL LOW (ref 3.87–5.11)
RDW: 14 % (ref 11.5–15.5)
WBC: 14.9 10*3/uL — ABNORMAL HIGH (ref 4.0–10.5)
nRBC: 0 % (ref 0.0–0.2)

## 2018-10-08 LAB — BIRTH TISSUE RECOVERY COLLECTION (PLACENTA DONATION)

## 2018-10-08 MED ORDER — OXYCODONE HCL 5 MG PO TABS
5.0000 mg | ORAL_TABLET | ORAL | Status: DC | PRN
  Administered 2018-10-08 – 2018-10-11 (×9): 5 mg via ORAL
  Filled 2018-10-08 (×9): qty 1

## 2018-10-08 NOTE — Lactation Note (Signed)
This note was copied from a baby's chart. Lactation Consultation Note Baby 58 hrs old.  Mom holding baby STS. Baby has been spitting. Newborn behavior, feeding habits, STS, I&O, breast massage, supply and demand discussed. Mom has tubular breast, compressible flat nipples that evert w/stimulation. Demonstrated finger stimulation before latching. Hand pump given to pre-pump if needed. Shells given to wear if breast tissue gets edematous and needs nipples everted. Hand expression demonstrated w/drops of colostrum collected for mom to give baby to stimulate feeding. Mom states she has a nice DEBP at home. Encouraged mom to call for assistance for feeding if needed. Lactation brochure given.  Patient Name: [name] [id number] Date: 10/08/2018 Reason for consult: Initial assessment;1st time breastfeeding;Term   Maternal Data Has patient been taught Hand Expression?: Yes Does the patient have breastfeeding experience prior to this delivery?: No  Feeding    LATCH Score Latch: Too sleepy or reluctant, no latch achieved, no sucking elicited.     Type of Nipple: Flat  Comfort (Breast/Nipple): Soft / non-tender        Interventions Interventions: Breast feeding basics reviewed;Skin to skin;Breast massage;Expressed milk;Hand express;Pre-pump if needed;Shells;Breast compression;Hand pump  Lactation Tools Discussed/Used Tools: Shells;Pump Shell Type: Inverted Breast pump type: Manual WIC Program: No Pump Review: Setup, frequency, and cleaning;Milk Storage Initiated by:: Allayne Stack RN IBCLC Date initiated:: 10/08/18   Consult Status Consult Status: Follow-up Date: 10/08/18 Follow-up type: In-patient    Daud Cayer, [place] 10/08/2018, 1:58 AM

## 2018-10-08 NOTE — Progress Notes (Addendum)
Patient is doing well.  She is tolerating PO, ambulating.  Foley catheter has been removed and has voided.  Pain is controlled.  Lochia is appropriate  Vitals:   10/08/18 0228 10/08/18 0331 10/08/18 0624 10/08/18 1010  BP: 101/63 92/63 (!) 100/55 98/68  Pulse: 93 (!) 105 92 91  Resp:   18 18  Temp:   98.5 F (36.9 C) 98.2 F (36.8 C)  TempSrc:   Oral Oral  SpO2:   99% 99%  Weight:      Height:        NAD Abdomen:  soft, appropriate tenderness, pressure dressing in place ext:    Symmetric, no edema bilaterally  Lab Results  Component Value Date   WBC 14.9 (H) 10/08/2018   HGB 8.3 (L) 10/08/2018   HCT 24.9 (L) 10/08/2018   MCV 92.2 10/08/2018   PLT 163 10/08/2018    --/--/O POS, O POS Performed at [facility] [address] [place], [place] [address]  (06/23 1400)/RImmune  A/P    23 y.o. G1P1001 POD 1 s/p primary cesarean section for arrest of descent Routine post op and postpartum care.   Remove pressure dressing after shower this AM--nurse alerted Baby in NICU for poor feeding effort and tachypnea

## 2018-10-08 NOTE — Clinical Social Work Maternal (Signed)
CLINICAL SOCIAL WORK MATERNAL/CHILD NOTE  Patient Details  Name: Colleen Phelps MRN: [id number] Date of Birth: July 08, 1995  Date:  10/08/2018  Clinical Social Worker Initiating Note:  Abundio Miu, [place] Date/Time: Initiated:  10/08/18/1041     Child's Name:  Colleen Phelps   Biological Parents:  Mother, Father(Father: Colleen Phelps)   Need for Interpreter:  None   Reason for Referral:  Other (Comment)(NICU Admission;; Hx of Depression and Anxiety)   Address:  [place] [address]    Phone number:  [phone] (home)     Additional phone number:   Household Members/Support Persons (HM/SP):   Household Member/Support Person 1   HM/SP Name Relationship DOB or Age  HM/SP -1 Colleen Verner Husband 09/24/1991  HM/SP -2        HM/SP -3        HM/SP -4        HM/SP -5        HM/SP -6        HM/SP -7        HM/SP -8          Natural Supports (not living in the home):  Other (Comment)(In-laws)   Professional Supports: None   Employment: Part-time   Type of Work: Lawyer   Education:  [place] arranged:    Museum/gallery curator Resources:  Multimedia programmer   Other Resources:      Cultural/Religious Considerations Which May Impact Care:    Strengths:  Ability to meet basic needs , Home prepared for child , Engineer, materials, Understanding of illness   Psychotropic Medications:         Pediatrician:    [place]olic[place] area  Pediatrician List:   [facility]      Pediatrician Fax Number:    Risk Factors/Current Problems:  None   Cognitive State:  Alert , Goal Oriented , Insightful , Linear Thinking , Able to Concentrate    Mood/Affect:  Interested , Relaxed , Calm    CSW Assessment: CSW met with MOB at bedside to discuss infant's NICU admission and history of  depression/anxiety. CSW introduced self and explained reason for consult. MOB was welcoming and engaged during assessment. 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 MOB reported that she has all items needed for infant including a car seat and basinet. MOB reported that she is from [place] and had a baby shower there right before the [place] 19 pandemic started. CSW inquired about MOB's support system, MOB reported that her husband and in-laws are her support system.  CSW inquired about MOB's mental health history, MOB denied any mental health history. MOB denied diagnosis of Anxiety and Depression. MOB reported that she had a lot going on last year with a big move, school and work. MOB reported that she was feeling down before learning about pregnancy and after learning about pregnancy everything got better. MOB presented calm and did not demonstrate any acute mental health signs/symptoms. CSW assessed for safety, MOB denied SI, HI and domestic violence.   CSW provided education regarding the baby blues period vs. perinatal mood disorders, discussed treatment and gave resources for mental health follow up if concerns arise.  CSW recommends self-evaluation during the postpartum time period using the New Mom Checklist from  Postpartum Progress and encouraged MOB to contact a medical professional if symptoms are noted at any time. MOB reported that she did a lot of reading on postpartum depression.   CSW provided review of Sudden Infant Death Syndrome (SIDS) precautions. MOB verbalized understanding and reported that infant would sleep in the basinet.   CSW and MOB discussed infant's NICU admission. MOB reported that it has been going good and she feels well informed through her husband. CSW informed MOB about the NICU, MOB denied any questions or concerns. CSW provided MOB with a butterfly from [employer]. CSW encouraged MOB to contact CSW if any needs/concerns  arise.   CSW will continue to offer support/resources while infant is admitted to the NICU.    CSW Plan/Description:  Sudden Infant Death Syndrome (SIDS) Education, Perinatal Mood and Anxiety Disorder (PMADs) Education, Other Patient/Family Education    Burnis Medin, LCSW 10/08/2018, 10:51 AM

## 2018-10-09 NOTE — Progress Notes (Signed)
Patient ID: Colleen Phelps, female   DOB: 1995-05-28, 23 y.o.   MRN: [id number]   Pt in NICU

## 2018-10-10 MED ORDER — IBUPROFEN 800 MG PO TABS
800.0000 mg | ORAL_TABLET | Freq: Three times a day (TID) | ORAL | Status: DC
  Administered 2018-10-10 – 2018-10-11 (×3): 800 mg via ORAL
  Filled 2018-10-10 (×3): qty 1

## 2018-10-10 NOTE — Lactation Note (Addendum)
This note was copied from a baby's chart. Lactation Consultation Note  Patient Name: Colleen Phelps [id number] Date: 10/10/2018 Reason for consult: Follow-up assessment;NICU baby(mom aware she can have the NICU call and set up a LC assess/) Baby is 48 hours old in NICU.  [name] visited mom on her room #65  Mom requested the [place] review the DEBP and watch her pump.  LC reviewed both settings on the DEBP and checked the flanges.  Mom was using the #24 F and they appeared to be snug/ especially on the left side.  Mom denies discomfort. LC recommended changing to the #27 F langes and observe.  The left nipple at the Nipple - areola complex has cracking. After changing the flanges per  Mom more comfortable. LC recommended a dab of coconut oil on the nipple / areola prior to  Pumping and when pumping sitting up so the breast tissue molds in the flanges better.  LC reviewed sore nipple and engorgement prevention and tx , supply and demand, the importance  Of pumping both breast for 15 - 20 mins around the clock ( 8-12 times a day ), and especially when  Visiting baby in NICU .  Per mom has DEBO at home and a hand pump.  LC recommended prior to pumping hand express and after pumping to increase volume.   Per  Mom has been able to do STS with baby but not breast feed yet.  LC made mom aware when she is able to latch to have the NICU RN call ahead and arrange a time for  Feeding assessment with LC.   Mom and dad expressed they felt the [facility] consult was informative. Both receptive.   Mom has the Pamphlet with phone number and is aware of the [facility] resources.   [name] aware mom will need Coconut oil prior to D/C.   Maternal Data Has patient been taught Hand Expression?: Yes(LC enc mom to hand express, pump and hand express ) Does the patient have breastfeeding experience prior to this delivery?: No  Feeding Feeding Type: (per mom mentioned she has been able to put the baby STS )  LATCH  Score                   Interventions Interventions: Breast feeding basics reviewed;Expressed milk;Coconut oil;Hand pump;DEBP  Lactation Tools Discussed/Used WIC Program: No Pump Review: Setup, frequency, and cleaning;Milk Storage Initiated by:: ([place] reviewed prior to D/C and checked flange )   Consult Status Consult Status: PRN Date: (baby in NICU ) Follow-up type: In-patient    [place] 10/10/2018, 10:10 AM

## 2018-10-10 NOTE — Progress Notes (Signed)
Subjective: Postpartum Day 3: Cesarean Delivery Patient reports pain controlled, no nausea or vomiting, ambulating without difficult, working on pumping   Objective: Vital signs in last 24 hours: Temp:  [97.6 F (36.4 C)-98.2 F (36.8 C)] 98.2 F (36.8 C) (06/27 1428) Pulse Rate:  [73-91] 79 (06/27 1428) Resp:  [16-18] 16 (06/27 1428) BP: (113-120)/(67-81) 120/76 (06/27 1428) SpO2:  [100 %] 100 % (06/27 1428)  Physical Exam:  General: alert, cooperative and appears stated age Lochia: appropriate Uterine Fundus: firm Incision: healing well DVT Evaluation: No evidence of DVT seen on physical exam.  Recent Labs    10/08/18 0529  HGB 8.3*  HCT 24.9*    Assessment/Plan: Status post Cesarean section. Doing well postoperatively.  Continue current care.   Colleen Phelps 10/10/2018, 6:30 PM

## 2018-10-10 NOTE — Lactation Note (Signed)
This note was copied from a baby's chart. Lactation Consultation Note  Patient Name: Colleen Phelps YOVZC'H Date: 10/10/2018 Reason for consult: Term;Other (Comment);NICU baby(post dates/ mom on the phone - [place] asked dad for mom to call on the nurses station for [facility] - for D/C today)  Per previous [place] mom has a DEBP at home.    Maternal Data    Feeding Feeding Type: Formula  LATCH Score                   Interventions    Lactation Tools Discussed/Used     Consult Status Consult Status: Follow-up Date: 10/10/18 Follow-up type: In-patient    [place] 10/10/2018, 9:18 AM

## 2018-10-11 MED ORDER — DOCUSATE SODIUM 100 MG PO CAPS: 100.0000 mg | ORAL_CAPSULE | Freq: Two times a day (BID) | ORAL | 0 refills | Status: DC

## 2018-10-11 MED ORDER — OXYCODONE-ACETAMINOPHEN 5-325 MG PO TABS: 1.0000 | ORAL_TABLET | Freq: Four times a day (QID) | ORAL | 0 refills | Status: DC | PRN

## 2018-10-11 MED ORDER — IBUPROFEN 600 MG PO TABS: 600.0000 mg | ORAL_TABLET | Freq: Four times a day (QID) | ORAL | 0 refills | Status: DC | PRN

## 2018-10-11 NOTE — Lactation Note (Signed)
This note was copied from a baby's chart. Lactation Consultation Note  Patient Name: Colleen Phelps [id number]'R Date: 10/11/2018 Reason for consult: Term;Other (Comment);NICU baby(post dates)  As LC entered the room mom just finishing with pumping both breast with the DEBP  With good results - approx 30 ml. Per mom I've pumped x 6 since yesterday and  That's the best I've gotten.  [place] praised mom for her consistent pumping and encouraged to increase it to 8 times today.  LC reviewed supply and demand.  Per mom has been using the coconut oil prior to pumping and it has helped.  Sore  Nipple and engorgement prevention and tx reviewed.  LC recommended when she does go home to leave her DEBP Medela kit and basins in NICU for pumping  When visiting baby. And mom has DEBP at home.  Per mom has been STS with baby and she has tried to latch and then doesn't seem hungry.  Per mom Dr. Harrington Challenger said I would probably go home Monday.   LC reminded mom she can have the NICU RN to call when baby is able to latch for a feeding assessment.   Mom aware of the [facility] resources after D/C.  See mom PRN.    Maternal Data Does the patient have breastfeeding experience prior to this delivery?: No  Feeding Feeding Type: Formula  LATCH Score                   Interventions Interventions: Breast feeding basics reviewed  Lactation Tools Discussed/Used Tools: Pump;Flanges Flange Size: 27;24(per mom using the #27 and per mom comfortable and left nipple improved) Breast pump type: Double-Electric Breast Pump WIC Program: No Pump Review: Milk Storage;Setup, frequency, and cleaning(reviewed by [facility] / [facility] 6/27)   Consult Status Consult Status: PRN Follow-up type: Other (comment)(baby in NICU)    [place] 10/11/2018, 9:30 AM

## 2018-10-11 NOTE — Discharge Summary (Signed)
Obstetric Discharge Summary Reason for Admission: induction of labor Prenatal Procedures: NST and ultrasound Intrapartum Procedures: cesarean: low cervical, transverse Postpartum Procedures: none Complications-Operative and Postpartum: none Hemoglobin  Date Value Ref Range Status  10/08/2018 8.3 (L) 12.0 - 15.0 g/dL Final    Comment:    REPEATED TO VERIFY   HCT  Date Value Ref Range Status  10/08/2018 24.9 (L) 36.0 - 46.0 % Final    Physical Exam:  General: alert, cooperative and appears stated age 23: appropriate Uterine Fundus: firm Incision: healing well DVT Evaluation: No evidence of DVT seen on physical exam.  Discharge Diagnoses: Term Pregnancy-delivered  Discharge Information: Date: 10/11/2018 Activity: pelvic rest Diet: routine Medications: Ibuprofen, Colace and Percocet Condition: improved Instructions: refer to practice specific booklet Discharge to: home Follow-up Information    [name], DO Follow up in 4 week(s).   Specialty: Obstetrics and Gynecology Why: For a postpartum evaluation Contact information: [address] [place] [address] [phone]           Newborn Data: Live born female  Birth Weight: 9 lb 1.3 oz (4120 g) APGAR: 61, 9  Newborn Delivery   Birth date/time: 10/07/2018 11:45:00 Delivery type: C-Section, Low Transverse Trial of labor: Yes C-section categorization: Primary      Home with NICU.  Colleen Phelps 10/11/2018, 12:54 PM

## 2018-10-15 ENCOUNTER — Ambulatory Visit: Payer: Self-pay

## 2018-10-15 NOTE — Lactation Note (Signed)
This note was copied from a baby's chart. Lactation Consultation Note  Patient Name: Colleen Phelps RUEAV'W Date: 10/15/2018 Reason for consult: Follow-up assessment;NICU baby   1244 - 1258 - I followed up with [name] upon RN request. Baby "Deeann Dowse" latched for the first time yesterday. [name] wanted assistance with latching. Baby had already fed prior to my entry, and we made an appointment for me to return tomorrow around 1115.   [name] is pumping approximately 2.5-3 ounces per pump. She has a goal to pump 8 times a day, and she is trying to stay on target. Baby is now receiving her pumped milk exclusively. I reviewed pumping frequency and encouraged Ms. Newborn to try to track her pumps with an app. She is being diligent to pump at night.  She states that her nipples are sore. She is using size 27 flanges, but her left nipples is larger than the right, and she will likely need a 30 for that side. I did not have that in stock, but I offered to bring that to her tomorrow. She is to use a little coconut oil on the flange to help improve the breast tissue movement.  Baby is showing readiness for feedings at the breast. Mom states that she latched well yesterday, but today she has not latched her. We discussed breast feeding basics for babies in the NICU, and I encouraged her to continue to allow her to lick and learn.  I will return tomorrow to assist with a feeding.  Feeding Feeding Type: Breast Milk   Interventions Interventions: Breast feeding basics reviewed;DEBP  Lactation Tools Discussed/Used Flange Size: 27;30(30 for the left and 27 for the right) Breast pump type: Double-Electric Breast Pump Pump Review: Setup, frequency, and cleaning   Consult Status Consult Status: Follow-up Date: 10/16/18 Follow-up type: In-patient    [name] 10/15/2018, 1:20 PM

## 2018-10-16 ENCOUNTER — Ambulatory Visit: Payer: Self-pay

## 2018-10-16 NOTE — Lactation Note (Signed)
This note was copied from a baby's chart. Lactation Consultation Note  Patient Name: Colleen Phelps ASTMH'D Date: 10/16/2018   1124 - 63 - I reported back to Ms. Rathel as requested on 7/2. She states that Colleen Phelps is latching well, and she feels comfortable breast feeding without assistance. I brought Ms. Waldvogel a size 30 flange for her left breast. She is pumping 8 times a day.   She states that she can pump 2-2.5 ounces a pump using her DEBP called a "Unimom." She states that Colleen Phelps has been softening the breasts, and she can hear swallows when baby is feeding. Today, she is staying all day to breast feed baby, and the goal is to see if Colleen Phelps can feed independent of other supplementation.   I encouraged Ms Royse to call for assistance PRN. She verbalized understanding.   Feeding Feeding Type: Breast Fed   Lactation Tools Discussed/Used  flanges   Consult Status   PRN   [name] 10/16/2018, 3:54 PM

## 2018-12-17 DIAGNOSIS — Z309 Encounter for contraceptive management, unspecified: Secondary | ICD-10-CM | POA: Diagnosis not present

## 2018-12-22 DIAGNOSIS — Z3042 Encounter for surveillance of injectable contraceptive: Secondary | ICD-10-CM | POA: Diagnosis not present

## 2019-04-11 DIAGNOSIS — Z20828 Contact with and (suspected) exposure to other viral communicable diseases: Secondary | ICD-10-CM | POA: Diagnosis not present

## 2020-04-15 NOTE — L&D Delivery Note (Signed)
OB/GYN Faculty Practice Delivery Note  Colleen Phelps is a 25 y.o. G2P1001 s/p VD at [redacted]w[redacted]d. She was admitted for eIOL/early labor.   ROM: 2h 62m with light mec fluid GBS Status:  Negative/-- (07/14 1025) Maximum Maternal Temperature: 97.48F  Labor Progress: Initial SVE: 3/60/-3. She then progressed to complete with assistance of pit.   Delivery Date/Time: 11/21/20 2306  Delivery: Patient found to be complete and started pushing, delivered in 3 pushes. Head delivered left OA. No nuchal cord present. Shoulder and body delivered in usual fashion. Infant with spontaneous cry, placed on mother's abdomen, dried and stimulated. Cord clamped x 2 after 1-minute delay, and cut by father. Cord blood drawn. Placenta delivered spontaneously with gentle cord traction. Fundus firm with massage and Pitocin. Labia, perineum, vagina, and cervix inspected inspected with no lacerations.  Baby Weight: pending  Placenta: Sent to L&D Complications: None Lacerations: None  EBL: 200 mL Analgesia: Epidural   Infant:  APGAR (1 MIN): 9   APGAR (5 MINS): 9   APGAR (10 MINS):     Leticia Penna, DO  OB Family Medicine Fellow, [facility] for [facility], [facility] 11/21/2020, 11:47 PM

## 2020-05-23 ENCOUNTER — Encounter: Payer: Self-pay | Admitting: *Deleted

## 2020-05-23 ENCOUNTER — Encounter: Payer: Self-pay | Admitting: Student

## 2020-05-24 ENCOUNTER — Other Ambulatory Visit (HOSPITAL_COMMUNITY)
Admission: RE | Admit: 2020-05-24 | Discharge: 2020-05-24 | Disposition: A | Discharge status: Home / Self Care | Payer: BC Managed Care – PPO | Source: Ambulatory Visit | Attending: Student | Admitting: Student

## 2020-05-24 ENCOUNTER — Encounter: Payer: Self-pay | Admitting: Student

## 2020-05-24 ENCOUNTER — Encounter: Payer: Self-pay | Admitting: *Deleted

## 2020-05-24 ENCOUNTER — Ambulatory Visit (INDEPENDENT_AMBULATORY_CARE_PROVIDER_SITE_OTHER): Payer: BC Managed Care – PPO | Admitting: Student

## 2020-05-24 ENCOUNTER — Other Ambulatory Visit: Payer: Self-pay

## 2020-05-24 VITALS — BP 123/72 | HR 91 | Temp 98.5°F | Wt 150.0 lb

## 2020-05-24 DIAGNOSIS — O34219 Maternal care for unspecified type scar from previous cesarean delivery: Secondary | ICD-10-CM | POA: Insufficient documentation

## 2020-05-24 DIAGNOSIS — O0932 Supervision of pregnancy with insufficient antenatal care, second trimester: Secondary | ICD-10-CM | POA: Insufficient documentation

## 2020-05-24 DIAGNOSIS — Z3480 Encounter for supervision of other normal pregnancy, unspecified trimester: Secondary | ICD-10-CM

## 2020-05-24 DIAGNOSIS — O26892 Other specified pregnancy related conditions, second trimester: Secondary | ICD-10-CM

## 2020-05-24 DIAGNOSIS — Z3482 Encounter for supervision of other normal pregnancy, second trimester: Secondary | ICD-10-CM | POA: Insufficient documentation

## 2020-05-24 DIAGNOSIS — Z3A21 21 weeks gestation of pregnancy: Secondary | ICD-10-CM

## 2020-05-24 DIAGNOSIS — R519 Headache, unspecified: Secondary | ICD-10-CM

## 2020-05-24 DIAGNOSIS — O09292 Supervision of pregnancy with other poor reproductive or obstetric history, second trimester: Secondary | ICD-10-CM | POA: Insufficient documentation

## 2020-05-24 MED ORDER — METOCLOPRAMIDE HCL 10 MG PO TABS: 10.0000 mg | ORAL_TABLET | Freq: Three times a day (TID) | ORAL | 0 refills | Status: DC | PRN

## 2020-05-24 MED ORDER — CYCLOBENZAPRINE HCL 5 MG PO TABS: 5.0000 mg | ORAL_TABLET | Freq: Three times a day (TID) | ORAL | 0 refills | Status: DC | PRN

## 2020-05-24 NOTE — Progress Notes (Signed)
Subjective:   Colleen Phelps is a 25 y.o. G2P1001 at [redacted]w[redacted]d by LMP & [facility] for her first obstetrical visit.  Her obstetrical history is significant for macrosomia and cesarean section. Patient does intend to breast feed. Pregnancy history fully reviewed. This is a planned pregnancy with her significant other. Did not establish care prior to now due to spouse getting new job & not having active insurance at the time.   Reports increased frequency of headaches. Has been treating with tylenol with minimal relief.  Denies any other complaints.     HISTORY: OB History  Gravida Para Term Preterm AB Living  2 1 1  0 0 1  SAB IAB Ectopic Multiple Live Births  0 0 0 0 1    # Outcome Date GA Lbr Len/2nd Weight Sex Delivery Anes PTL Lv  2 Current           1 Term 10/07/18 [redacted]w[redacted]d 29:17 / 02:58 9 lb 1.3 oz (4.12 kg) F CS-LTranv EPI  LIV     Name: NYLEE, BARBUTO     Apgar1: 9  Apgar5: 9   Past Medical History:  Diagnosis Date  . Medical history non-contributory    Past Surgical History:  Procedure Laterality Date  . CESAREAN SECTION N/A 10/07/2018   Procedure: CESAREAN SECTION;  Surgeon: 10/09/2018, DO;  Location: [facility] LD [facility];  Service: Obstetrics;  Laterality: N/A;   History reviewed. No pertinent family history. Social History   Tobacco Use  . Smoking status: Never Smoker  . Smokeless tobacco: Never Used  Vaping Use  . Vaping Use: Never used  Substance Use Topics  . Alcohol use: Yes  . Drug use: Never   No Known Allergies Current Outpatient Medications on File Prior to Visit  Medication Sig Dispense Refill  . acetaminophen (TYLENOL) 500 MG tablet Take 500 mg by mouth every 6 (six) hours as needed.     No current facility-administered medications on file prior to visit.     Exam   Vitals:   05/24/20 1326  BP: 123/72  Pulse: 91  Temp: 98.5 F (36.9 C)  Weight: 150 lb (68 kg)   Fetal Heart Rate (bpm):  155  Uterus:   Fundal height at umbilicus  System: General: well-developed, well-nourished female in no acute distress   Skin: normal coloration and turgor, no rashes   Neurologic: oriented, normal, negative, normal mood   Extremities: normal strength, tone, and muscle mass, ROM of all joints is normal   HEENT PERRLA, extraocular movement intact and sclera clear, anicteric   Mouth/Teeth mucous membranes moist, pharynx normal without lesions and dental hygiene good   Neck supple and no masses   Cardiovascular: regular rate and rhythm   Respiratory:  no respiratory distress, normal breath sounds   Abdomen: soft, non-tender; bowel sounds normal; no masses,  no organomegaly     Assessment:   Pregnancy: G2P1001 Patient Active Problem List   Diagnosis Date Noted  . Supervision of other normal pregnancy, antepartum 05/24/2020  . History of cesarean delivery, currently pregnant 05/24/2020  . Hx of macrosomia in infant in prior pregnancy, currently pregnant, second trimester 05/24/2020  . Late prenatal care affecting pregnancy in second trimester 05/24/2020     Plan:  1. Supervision of other normal pregnancy, antepartum  - Culture, OB Urine - CBC/D/Plt+RPR+Rh+ABO+Rub Ab... - Hemoglobin A1c - Genetic Screening - AFP, Serum, Open Spina Bifida - Cervicovaginal ancillary only( [place]) - 07/22/2020  MFM OB COMP + 14 WK; Future  2. History of cesarean delivery, currently pregnant -desires TOLAC. Will need MD appointment for consent  3. Hx of macrosomia in infant in prior pregnancy, currently pregnant, second trimester -first baby was 4120 gm at [redacted]w[redacted]d. She was not diabetic. He spouse was 5 lbs at birth & she was 6 lbs.  -hemoglobin A1C collected today  4. Late prenatal care affecting pregnancy in second trimester -Need anatomy scan scheduled asap - [place] MFM OB COMP + 14 WK; Future  5. Pregnancy headache in second trimester -discussed headache management during pregnancy & given written  instructions regarding supplements & medication. If worsen, may consider referral to headache clinic - metoCLOPramide (REGLAN) 10 MG tablet; Take 1 tablet (10 mg total) by mouth every 8 (eight) hours as needed (take with tylenol for headaches).  Dispense: 30 tablet; Refill: 0 - cyclobenzaprine (FLEXERIL) 5 MG tablet; Take 1 tablet (5 mg total) by mouth 3 (three) times daily as needed (headaches).  Dispense: 20 tablet; Refill: 0  6. [redacted] weeks gestation of pregnancy  - [place] MFM OB COMP + 14 WK; Future   Initial labs drawn Continue prenatal vitamins. Genetic Screening discussed, NIPS: ordered. Ultrasound discussed; fetal anatomic survey: ordered. Problem list reviewed and updated. The nature of [facility] with multiple MDs and other Advanced Practice Providers was explained to patient; also emphasized that residents, students are part of our team. Routine obstetric precautions reviewed. Return in about 4 weeks (around 06/21/2020) for Routine OB, virtual if she has mychart.   Colleen Phelps 2:23 PM 05/24/20

## 2020-05-24 NOTE — Patient Instructions (Signed)
For prevention of migraines in pregnancy: -Magnesium, 400mg  by mouth, once daily -Vitamin B2, 400mg  by mouth, once daily  For treatment of migraines in pregnancy: -take medication at the first sign of the pain of a headache, or the first sign of your aura -start with 1000mg  Tylenol (do not exceed 4000mg  of Tylenol in 24hrs), with or without Reglan 10mg  -if no relief after 1-2hours, can take Flexeril 10mg        Preparing for Vaginal Birth After Cesarean Delivery Vaginal birth after cesarean delivery (VBAC) is giving birth vaginally after previously delivering a baby through a cesarean section (C-section). You and your health are provider will discuss your options and whether you may be a good candidate for VBAC. What are my options? After a cesarean delivery, your options for future deliveries may include:  Scheduled repeat cesarean delivery. This is done in a hospital with an operating room.  Trial of labor after cesarean (TOLAC). A successful TOLAC results in a vaginal delivery. If it is not successful, you will need to have a cesarean delivery. TOLAC should be attempted in facilities where an emergency cesarean delivery can be performed. It should not be done as a home birth. Talk with your health care provider about the risks and benefits of each option early in your pregnancy. The best option for you will depend on your preferences and your overall health as well as your baby's. What should I know about my past cesarean delivery? It is important to know what type of incision was made in your uterus in a past cesarean delivery. The type of incision can affect the success of your TOLAC. Types of incisions include:  Low transverse. This is a side-to-side cut low on your uterus. The scar on your skin looks like a horizontal line just above your pubic area. This type of cut is the most common and makes you a good candidate for TOLAC.  Low vertical. This is an up-and-down cut low on your  uterus. The scar on your skin looks like a vertical line between your pubic area and belly button. This type of cut puts you at higher risk for problems during TOLAC.  High vertical or classical. This is an up-and-down cut high on your uterus. The scar on your skin looks like a vertical line that runs over the top of your belly button. This type of cut has the highest risk for problems and usually means that TOLAC is not an option.   When is VBAC not an option? As you progress through your pregnancy, circumstances may change and you may need to reconsider your options. Your situation may also change even as you begin TOLAC. Your health care provider may not want you to attempt a VBAC if you:  Need to have labor started (induced) because your cervix is not ready for labor.  Have never had a vaginal delivery.  Have had more than two cesarean deliveries.  Are overdue.  Are pregnant with a very large baby.  Have a condition that causes high blood pressure (preeclampsia). Questions to ask your health care provider  Am I a good candidate for TOLAC?  What are my chances of a successful vaginal delivery?  Is my preferred birth location equipped for a TOLAC?  What are my pain management options during a TOLAC? Where to find more information  American Congress of Obstetricians and Gynecologists: www.acog.org  of Nurse-Midwives: www.midwife.org Summary  Vaginal birth after cesarean delivery (VBAC) is giving birth vaginally after  previously delivering a baby through a cesarean section (C-section).  VBAC may be a safe and appropriate option for you depending on your medical history and other risk factors. Talk with your health care provider about the options available to you, and the risks and benefits of each early in your pregnancy.  TOLAC should be attempted in facilities where emergency cesarean section procedures can be performed. This information is not intended to  replace advice given to you by your health care provider. Make sure you discuss any questions you have with your health care provider. Document Revised: 07/28/2018 Document Reviewed: 07/11/2016 Elsevier Patient Education  2021 [employer].

## 2020-05-26 LAB — CBC/D/PLT+RPR+RH+ABO+RUB AB...
Antibody Screen: NEGATIVE
Basophils Absolute: 0 10*3/uL (ref 0.0–0.2)
Basos: 1 %
EOS (ABSOLUTE): 0.1 10*3/uL (ref 0.0–0.4)
Eos: 1 %
HCV Ab: <0.1 s/co ratio (ref 0.0–0.9)
HIV Screen 4th Generation wRfx: NONREACTIVE
Hematocrit: 38.4 % (ref 34.0–46.6)
Hemoglobin: 13 g/dL (ref 11.1–15.9)
Hepatitis B Surface Ag: NEGATIVE
Immature Grans (Abs): 0 10*3/uL (ref 0.0–0.1)
Immature Granulocytes: 0 %
Lymphocytes Absolute: 1.8 10*3/uL (ref 0.7–3.1)
Lymphs: 22 %
MCH: 30.9 pg (ref 26.6–33.0)
MCHC: 33.9 g/dL (ref 31.5–35.7)
MCV: 91 fL (ref 79–97)
Monocytes Absolute: 0.6 10*3/uL (ref 0.1–0.9)
Monocytes: 8 %
Neutrophils Absolute: 5.7 10*3/uL (ref 1.4–7.0)
Neutrophils: 68 %
Platelets: 267 10*3/uL (ref 150–450)
RBC: 4.21 x10E6/uL (ref 3.77–5.28)
RDW: 12.4 % (ref 11.7–15.4)
RPR Ser Ql: NONREACTIVE
Rh Factor: POSITIVE
Rubella Antibodies, IGG: 1.85 index (ref >0.99)
WBC: 8.3 10*3/uL (ref 3.4–10.8)

## 2020-05-26 LAB — HCV INTERPRETATION

## 2020-05-26 LAB — AFP, SERUM, OPEN SPINA BIFIDA
AFP MoM: 0.16
AFP Value: 10.7 ng/mL
Gest. Age on Collection Date: 21 weeks
Maternal Age At EDD: 24.4 yr
OSBR Risk 1 IN: 10000
Test Results:: NEGATIVE
Weight: 150 [lb_av]

## 2020-05-26 LAB — CERVICOVAGINAL ANCILLARY ONLY
Bacterial Vaginitis (gardnerella): NEGATIVE
Candida Glabrata: NEGATIVE
Candida Vaginitis: NEGATIVE
Chlamydia: NEGATIVE
Comment: NEGATIVE
Comment: NEGATIVE
Comment: NEGATIVE
Comment: NEGATIVE
Comment: NEGATIVE
Comment: NORMAL
Neisseria Gonorrhea: NEGATIVE
Trichomonas: NEGATIVE

## 2020-05-26 LAB — CULTURE, OB URINE

## 2020-05-26 LAB — URINE CULTURE, OB REFLEX

## 2020-05-26 LAB — HEMOGLOBIN A1C
Est. average glucose Bld gHb Est-mCnc: 100 mg/dL
Hgb A1c MFr Bld: 5.1 % (ref 4.8–5.6)

## 2020-05-30 ENCOUNTER — Encounter: Payer: Self-pay | Admitting: *Deleted

## 2020-06-05 ENCOUNTER — Ambulatory Visit: Payer: BC Managed Care – PPO | Attending: Student

## 2020-06-05 ENCOUNTER — Encounter: Payer: Self-pay | Admitting: *Deleted

## 2020-06-05 ENCOUNTER — Other Ambulatory Visit: Payer: Self-pay

## 2020-06-05 ENCOUNTER — Other Ambulatory Visit: Payer: Self-pay | Admitting: *Deleted

## 2020-06-05 DIAGNOSIS — O0932 Supervision of pregnancy with insufficient antenatal care, second trimester: Secondary | ICD-10-CM | POA: Diagnosis present

## 2020-06-05 DIAGNOSIS — Z3A21 21 weeks gestation of pregnancy: Secondary | ICD-10-CM | POA: Diagnosis present

## 2020-06-05 DIAGNOSIS — Z3480 Encounter for supervision of other normal pregnancy, unspecified trimester: Secondary | ICD-10-CM | POA: Diagnosis present

## 2020-06-05 DIAGNOSIS — Z362 Encounter for other antenatal screening follow-up: Secondary | ICD-10-CM

## 2020-06-06 ENCOUNTER — Other Ambulatory Visit: Payer: Self-pay | Admitting: *Deleted

## 2020-06-06 ENCOUNTER — Encounter: Payer: Self-pay | Admitting: *Deleted

## 2020-06-12 ENCOUNTER — Encounter: Payer: Self-pay | Admitting: Student

## 2020-06-12 DIAGNOSIS — Z141 Cystic fibrosis carrier: Secondary | ICD-10-CM | POA: Insufficient documentation

## 2020-06-12 DIAGNOSIS — O09892 Supervision of other high risk pregnancies, second trimester: Secondary | ICD-10-CM | POA: Insufficient documentation

## 2020-06-21 ENCOUNTER — Other Ambulatory Visit: Payer: Self-pay

## 2020-06-21 ENCOUNTER — Telehealth (INDEPENDENT_AMBULATORY_CARE_PROVIDER_SITE_OTHER): Payer: BC Managed Care – PPO | Admitting: Obstetrics and Gynecology

## 2020-06-21 DIAGNOSIS — Z3A18 18 weeks gestation of pregnancy: Secondary | ICD-10-CM

## 2020-06-21 DIAGNOSIS — O34219 Maternal care for unspecified type scar from previous cesarean delivery: Secondary | ICD-10-CM

## 2020-06-21 DIAGNOSIS — Z3480 Encounter for supervision of other normal pregnancy, unspecified trimester: Secondary | ICD-10-CM

## 2020-06-21 DIAGNOSIS — O0932 Supervision of pregnancy with insufficient antenatal care, second trimester: Secondary | ICD-10-CM

## 2020-06-21 DIAGNOSIS — O09892 Supervision of other high risk pregnancies, second trimester: Secondary | ICD-10-CM

## 2020-06-21 NOTE — Progress Notes (Signed)
MY CHART VIDEO VIRTUAL/TELEPHONE OBSTETRICS VISIT ENCOUNTER NOTE  I connected with Colleen Phelps on 06/21/20 at  1:30 PM EST by My Chart video at home and verified that I am speaking with the correct person using two identifiers. Provider located at [facility] [employer] for [employer] at [place].   I discussed the limitations, risks, security and privacy concerns of performing an evaluation and management service by My Chart video and the availability of in person appointments. I also discussed with the patient that there may be a patient responsible charge related to this service. The patient expressed understanding and agreed to proceed.  The patient had technical difficulties with the video requiring transitioning to audio format (telephone) prior to the end of the visit.  Subjective:  Colleen Phelps is a 25 y.o. G2P1001 at [redacted]w[redacted]d being followed for ongoing prenatal care.  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  Patient reports LT side pain/pressure, increases with movement. Reports fetal movement. Denies any contractions, bleeding or leaking of fluid.   The following portions of the patient's history were reviewed and updated as appropriate: allergies, current medications, past family history, past medical history, past social history, past surgical history and problem list.   Objective:   General:  Alert, oriented and cooperative.   Mental Status: Normal mood and affect perceived. Normal judgment and thought content.  Rest of physical exam deferred due to type of encounter  LMP 12/23/2019  **Done by patient's own  at home BP cuff and scale  Assessment and Plan:  Pregnancy: G2P1001 at [redacted]w[redacted]d  1. Supervision of other normal pregnancy, antepartum - Discussed anatomy U/S scheduled for 07/03/2020 - Plan next visit after then to discuss results - Information provided on RLP - Encouraged to purchase maternity support belt from [facility], lay on the side that hurts, change positions slowly and carefully   2. History of cesarean delivery, currently pregnant - Planning TOLAC- Needs to see MD to sign consent  3. Late prenatal care affecting pregnancy in second trimester  4. [redacted] weeks gestation of pregnancy    Preterm labor symptoms and general obstetric precautions including but not limited to vaginal bleeding, contractions, leaking of fluid and fetal movement were reviewed in detail with the patient.  I discussed the assessment and treatment plan with the patient. The patient was provided an opportunity to ask questions and all were answered. The patient agreed with the plan and demonstrated an understanding of the instructions. The patient was advised to call back or seek an in-person office evaluation/go to [facility] at [facility] for any urgent or concerning symptoms. Please refer to After Visit Summary for other counseling recommendations.   I provided 10 minutes of non-face-to-face time during this encounter. There was 5 minutes of chart review time spent prior to this encounter. Total time spent = 15 minutes.  Return in about 4 weeks (around 07/19/2020) for Return OB visit.  Future Appointments  Date Time Provider Department Center  06/21/2020  1:30 PM Raelyn Mora, CNM [facility] None  07/03/2020  1:00 PM [facility] NURSE [facility]  07/03/2020  1:15 PM [facility] US2 [facility]    Raelyn Mora, CNM Center for [employer], [facility]

## 2020-06-21 NOTE — Patient Instructions (Signed)

## 2020-06-24 ENCOUNTER — Encounter: Payer: Self-pay | Admitting: Obstetrics and Gynecology

## 2020-07-03 ENCOUNTER — Ambulatory Visit: Payer: BC Managed Care – PPO | Attending: Obstetrics and Gynecology

## 2020-07-03 ENCOUNTER — Ambulatory Visit: Payer: BC Managed Care – PPO | Admitting: *Deleted

## 2020-07-03 ENCOUNTER — Encounter: Payer: Self-pay | Admitting: *Deleted

## 2020-07-03 ENCOUNTER — Other Ambulatory Visit: Payer: Self-pay

## 2020-07-03 DIAGNOSIS — O09892 Supervision of other high risk pregnancies, second trimester: Secondary | ICD-10-CM | POA: Insufficient documentation

## 2020-07-03 DIAGNOSIS — Z148 Genetic carrier of other disease: Secondary | ICD-10-CM | POA: Diagnosis not present

## 2020-07-03 DIAGNOSIS — O34219 Maternal care for unspecified type scar from previous cesarean delivery: Secondary | ICD-10-CM | POA: Diagnosis present

## 2020-07-03 DIAGNOSIS — O09292 Supervision of pregnancy with other poor reproductive or obstetric history, second trimester: Secondary | ICD-10-CM

## 2020-07-03 DIAGNOSIS — Z3480 Encounter for supervision of other normal pregnancy, unspecified trimester: Secondary | ICD-10-CM

## 2020-07-03 DIAGNOSIS — Z3A20 20 weeks gestation of pregnancy: Secondary | ICD-10-CM

## 2020-07-03 DIAGNOSIS — O0932 Supervision of pregnancy with insufficient antenatal care, second trimester: Secondary | ICD-10-CM | POA: Insufficient documentation

## 2020-07-03 DIAGNOSIS — Z141 Cystic fibrosis carrier: Secondary | ICD-10-CM | POA: Insufficient documentation

## 2020-07-03 DIAGNOSIS — Z362 Encounter for other antenatal screening follow-up: Secondary | ICD-10-CM

## 2020-07-04 ENCOUNTER — Other Ambulatory Visit: Payer: Self-pay | Admitting: *Deleted

## 2020-07-04 DIAGNOSIS — O09299 Supervision of pregnancy with other poor reproductive or obstetric history, unspecified trimester: Secondary | ICD-10-CM

## 2020-07-17 ENCOUNTER — Ambulatory Visit: Payer: BC Managed Care – PPO | Attending: Obstetrics and Gynecology

## 2020-07-20 ENCOUNTER — Other Ambulatory Visit: Payer: Self-pay

## 2020-07-20 ENCOUNTER — Ambulatory Visit (INDEPENDENT_AMBULATORY_CARE_PROVIDER_SITE_OTHER): Payer: BC Managed Care – PPO | Admitting: Obstetrics and Gynecology

## 2020-07-20 VITALS — BP 121/88 | HR 108 | Temp 98.2°F | Wt 160.4 lb

## 2020-07-20 DIAGNOSIS — Z3A22 22 weeks gestation of pregnancy: Secondary | ICD-10-CM

## 2020-07-20 DIAGNOSIS — O34219 Maternal care for unspecified type scar from previous cesarean delivery: Secondary | ICD-10-CM

## 2020-07-20 DIAGNOSIS — Z3480 Encounter for supervision of other normal pregnancy, unspecified trimester: Secondary | ICD-10-CM

## 2020-07-20 NOTE — Patient Instructions (Addendum)
Fast after midnight without anything to eat or drink (except for water), will have fasting blood drawn, drink the glucola drink (flavor choices: orange, fruit punch or lemon-lime), have a visit with a provider during the first hour of testing, wait in the lab waiting room to have blood drawn at 1 hour and then 2 hours after finishing glucola drink.   Oral Glucose Tolerance Test During Pregnancy Why am I having this test? The oral glucose tolerance test (OGTT) is done to check how your body processes blood sugar (glucose). This is one of several tests used to diagnose diabetes that develops during pregnancy (gestational diabetes mellitus). Gestational diabetes is a short-term form of diabetes that some women develop while they are pregnant. It usually occurs during the second trimester of pregnancy and goes away after delivery. Testing, or screening, for gestational diabetes usually occurs at weeks 24-28 of pregnancy. You may have the OGTT test after having a 1-hour glucose screening test if the results from that test indicate that you may have gestational diabetes. This test may also be needed if:  You have a history of gestational diabetes.  There is a history of giving birth to very large babies or of losing pregnancies (having stillbirths).  You have signs and symptoms of diabetes, such as: ? Changes in your eyesight. ? Tingling or numbness in your hands or feet. ? Changes in hunger, thirst, and urination, and these are not explained by your pregnancy. What is being tested? This test measures the amount of glucose in your blood at different times during a period of 3 hours. This shows how well your body can process glucose. What kind of sample is taken? Blood samples are required for this test. They are usually collected by inserting a needle into a blood vessel.   How do I prepare for this test?  For 3 days before your test, eat normally. Have plenty of carbohydrate-rich foods.  Follow  instructions from your health care provider about: ? Eating or drinking restrictions on the day of the test. You may be asked not to eat or drink anything other than water (to fast) starting 8-10 hours before the test. ? Changing or stopping your regular medicines. Some medicines may interfere with this test. Tell a health care provider about:  All medicines you are taking, including vitamins, herbs, eye drops, creams, and over-the-counter medicines.  Any blood disorders you have.  Any surgeries you have had.  Any medical conditions you have. What happens during the test? First, your blood glucose will be measured. This is referred to as your fasting blood glucose because you fasted before the test. Then, you will drink a glucose solution that contains a certain amount of glucose. Your blood glucose will be measured again 1, 2, and 3 hours after you drink the solution. This test takes about 3 hours to complete. You will need to stay at the testing location during this time. During the testing period:  Do not eat or drink anything other than the glucose solution.  Do not exercise.  Do not use any products that contain nicotine or tobacco, such as cigarettes, e-cigarettes, and chewing tobacco. These can affect your test results. If you need help quitting, ask your health care provider. The testing procedure may vary among health care providers and hospitals. How are the results reported? Your results will be reported as milligrams of glucose per deciliter of blood (mg/dL) or millimoles per liter (mmol/L). There is more than one source for screening  and diagnosis reference values used to diagnose gestational diabetes. Your health care provider will compare your results to normal values that were established after testing a large group of people (reference values). Reference values may vary among labs and hospitals. For this test (Carpenter-Coustan), reference values are:  Fasting: 95 mg/dL (5.3  mmol/L).  1 hour: 180 mg/dL (49.2 mmol/L).  2 hour: 155 mg/dL (8.6 mmol/L).  3 hour: 140 mg/dL (7.8 mmol/L). What do the results mean? Results below the reference values are considered normal. If two or more of your blood glucose levels are at or above the reference values, you may be diagnosed with gestational diabetes. If only one level is high, your health care provider may suggest repeat testing or other tests to confirm a diagnosis. Talk with your health care provider about what your results mean. Questions to ask your health care provider Ask your health care provider, or the department that is doing the test:  When will my results be ready?  How will I get my results?  What are my treatment options?  What other tests do I need?  What are my next steps? Summary  The oral glucose tolerance test (OGTT) is one of several tests used to diagnose diabetes that develops during pregnancy (gestational diabetes mellitus). Gestational diabetes is a short-term form of diabetes that some women develop while they are pregnant.  You may have the OGTT test after having a 1-hour glucose screening test if the results from that test show that you may have gestational diabetes. You may also have this test if you have any symptoms or risk factors for this type of diabetes.  Talk with your health care provider about what your results mean. This information is not intended to replace advice given to you by your health care provider. Make sure you discuss any questions you have with your health care provider. Document Revised: 09/09/2019 Document Reviewed: 09/09/2019 Elsevier Patient Education  2021 [employer].

## 2020-07-20 NOTE — Progress Notes (Signed)
   LOW-RISK PREGNANCY OFFICE VISIT Patient name: Colleen Phelps MRN [id number]  Date of birth: Apr 30, 1995 Chief Complaint:   Routine Prenatal Visit  History of Present Illness:   Colleen Phelps is a 25 y.o. G12P1001 female at [redacted]w[redacted]d with an Estimated Date of Delivery: 11/20/20 being seen today for ongoing management of a low-risk pregnancy.  Today she reports no complaints. She reports that she was told she needed to repeat the [facility] lab test, due to her "being too early the first time." H/O primary C/S for 9# baby in OP presentation "she got stuck." She and husband are considering RCS vs TOLAC.  Contractions: Not present. Vag. Bleeding: None.  Movement: Present. denies leaking of fluid. Review of Systems:   Pertinent items are noted in HPI Denies abnormal vaginal discharge w/ itching/odor/irritation, headaches, visual changes, shortness of breath, chest pain, abdominal pain, severe nausea/vomiting, or problems with urination or bowel movements unless otherwise stated above. Pertinent History Reviewed:  Reviewed past medical,surgical, social, obstetrical and family history.  Reviewed problem list, medications and allergies. Physical Assessment:   Vitals:   07/20/20 1013  BP: 121/88  Pulse: (!) 108  Temp: 98.2 F (36.8 C)  Weight: 160 lb 6.4 oz (72.8 kg)  Body mass index is 28.41 kg/m.        Physical Examination:   General appearance: Well appearing, and in no distress  Mental status: Alert, oriented to person, place, and time  Skin: Warm & dry  Cardiovascular: Normal heart rate noted  Respiratory: Normal respiratory effort, no distress  Abdomen: Soft, gravid, nontender  Pelvic: Cervical exam deferred         Extremities: Edema: None  Fetal Status: Fetal Heart Rate (bpm): 160    Movement: Present    No results found for this or any previous visit (from the past 24 hour(s)).  Assessment & Plan:  1) Low-risk pregnancy G2P1001 at [redacted]w[redacted]d with an Estimated Date of Delivery:  11/20/20   2) Supervision of other normal pregnancy, antepartum  - Genetic Screening, - AFP, Serum, Open Spina Bifida - Advised to let the office know as soon as she makes a decision on if she would prefer RCS, since that needs to be scheduled in advanced and she needs to see one of our MDs for TOLAC consent or to discuss RCS   3) [redacted] weeks gestation of pregnancy  - Genetic Screening,  - AFP, Serum, Open Spina Bifida    Meds: No orders of the defined types were placed in this encounter.  Labs/procedures today: AFP & Panorama  Plan:  Continue routine obstetrical care   Reviewed: Preterm labor symptoms and general obstetric precautions including but not limited to vaginal bleeding, contractions, leaking of fluid and fetal movement were reviewed in detail with the patient.  All questions were answered. Has home bp cuff. Check bp weekly, let us know if >140/90.   Follow-up: Return in about 6 weeks (around 08/31/2020) for Return OB 2hr GTT.  Orders Placed This Encounter  Procedures  . Genetic Screening  . AFP, Serum, Open Spina Bifida   Raelyn Mora MSN, CNM 07/20/2020 10:31 AM

## 2020-07-22 LAB — AFP, SERUM, OPEN SPINA BIFIDA
AFP MoM: 0.44
AFP Value: 33.1 ng/mL
Gest. Age on Collection Date: 22 weeks
Maternal Age At EDD: 24.5 yr
OSBR Risk 1 IN: 10000
Test Results:: NEGATIVE
Weight: 160 [lb_av]

## 2020-08-28 ENCOUNTER — Other Ambulatory Visit: Payer: BC Managed Care – PPO | Admitting: *Deleted

## 2020-08-31 ENCOUNTER — Encounter: Payer: Self-pay | Admitting: Obstetrics and Gynecology

## 2020-08-31 ENCOUNTER — Other Ambulatory Visit: Payer: Self-pay

## 2020-08-31 ENCOUNTER — Ambulatory Visit (INDEPENDENT_AMBULATORY_CARE_PROVIDER_SITE_OTHER): Payer: BC Managed Care – PPO | Admitting: Obstetrics and Gynecology

## 2020-08-31 VITALS — BP 106/67 | HR 102 | Temp 98.2°F | Wt 166.8 lb

## 2020-08-31 DIAGNOSIS — O34219 Maternal care for unspecified type scar from previous cesarean delivery: Secondary | ICD-10-CM

## 2020-08-31 DIAGNOSIS — Z3480 Encounter for supervision of other normal pregnancy, unspecified trimester: Secondary | ICD-10-CM

## 2020-08-31 DIAGNOSIS — Z3A28 28 weeks gestation of pregnancy: Secondary | ICD-10-CM

## 2020-08-31 NOTE — Progress Notes (Signed)
   LOW-RISK PREGNANCY OFFICE VISIT Patient name: Colleen Phelps MRN [id number]  Date of birth: [birth year] Chief Complaint:   Routine Prenatal Visit  History of Present Illness:   Colleen Phelps is a 25 y.o. G54P1001 female at [redacted]w[redacted]d with an Estimated Date of Delivery: 11/20/20 being seen today for ongoing management of a low-risk pregnancy.  Today she reports no complaints. Contractions: Not present. Vag. Bleeding: None.  Movement: Present. denies leaking of fluid. Review of Systems:   Pertinent items are noted in HPI Denies abnormal vaginal discharge w/ itching/odor/irritation, headaches, visual changes, shortness of breath, chest pain, abdominal pain, severe nausea/vomiting, or problems with urination or bowel movements unless otherwise stated above. Pertinent History Reviewed:  Reviewed past medical,surgical, social, obstetrical and family history.  Reviewed problem list, medications and allergies. Physical Assessment:   Vitals:   08/31/20 0820  BP: 106/67  Pulse: (!) 102  Temp: 98.2 F (36.8 C)  Weight: 166 lb 12.8 oz (75.7 kg)  Body mass index is 29.55 kg/m.        Physical Examination:   General appearance: Well appearing, and in no distress  Mental status: Alert, oriented to person, place, and time  Skin: Warm & dry  Cardiovascular: Normal heart rate noted  Respiratory: Normal respiratory effort, no distress  Abdomen: Soft, gravid, nontender  Pelvic: Cervical exam deferred         Extremities: Edema: None  Fetal Status: Fetal Heart Rate (bpm): 148 Fundal Height: 28 cm Movement: Present Presentation: Undeterminable  No results found for this or any previous visit (from the past 24 hour(s)).  Assessment & Plan:  1) Low-risk pregnancy G2P1001 at [redacted]w[redacted]d with an Estimated Date of Delivery: 11/20/20   2) Supervision of other normal pregnancy, antepartum  - Glucose Tolerance, 2 Hours w/1 Hour,  - HIV Antibody (routine testing w rflx),  - RPR,  - CBC  3) History of  cesarean delivery, currently pregnant - Desires TOLAC>>needs appt with MD to sign consent  4) [redacted] weeks gestation of pregnancy    Meds: No orders of the defined types were placed in this encounter.  Labs/procedures today: 2 hr GTT and 3rd trimester labs  Plan:  Continue routine obstetrical care   Reviewed: Preterm labor symptoms and general obstetric precautions including but not limited to vaginal bleeding, contractions, leaking of fluid and fetal movement were reviewed in detail with the patient.  All questions were answered. Has home bp cuff. Check bp weekly, let us know if >140/90.   Follow-up: Return in about 4 weeks (around 09/28/2020) for Return OB - My Chart video.  Orders Placed This Encounter  Procedures  . Glucose Tolerance, 2 Hours w/1 Hour  . HIV Antibody (routine testing w rflx)  . RPR  . CBC   Raelyn Mora MSN, CNM 08/31/2020 8:44 AM

## 2020-08-31 NOTE — Patient Instructions (Signed)
Fetal Movement Counts Patient Name: ________________________________________________ Patient Due Date: ____________________  What is a fetal movement count? A fetal movement count is the number of times that you feel your baby move during a certain amount of time. This may also be called a fetal kick count. A fetal movement count is recommended for every pregnant woman. You may be asked to start counting fetal movements as early as week 28 of your pregnancy. Pay attention to when your baby is most active. You may notice your baby's sleep and wake cycles. You may also notice things that make your baby move more. You should do a fetal movement count:  When your baby is normally most active.  At the same time each day. A good time to count movements is while you are resting, after having something to eat and drink. How do I count fetal movements? 1. Find a quiet, comfortable area. Sit, or lie down on your side. 2. Write down the date, the start time and stop time, and the number of movements that you felt between those two times. Take this information with you to your health care visits. 3. Write down your start time when you feel the first movement. 4. Count kicks, flutters, swishes, rolls, and jabs. You should feel at least 10 movements. 5. You may stop counting after you have felt 10 movements, or if you have been counting for 2 hours. Write down the stop time. 6. If you do not feel 10 movements in 2 hours, contact your health care provider for further instructions. Your health care provider may want to do additional tests to assess your baby's well-being. Contact a health care provider if:  You feel fewer than 10 movements in 2 hours.  Your baby is not moving like he or she usually does. Date: ____________ Start time: ____________ Stop time: ____________ Movements: ____________ Date: ____________ Start time: ____________ Stop time: ____________ Movements: ____________ Date: ____________  Start time: ____________ Stop time: ____________ Movements: ____________ Date: ____________ Start time: ____________ Stop time: ____________ Movements: ____________ Date: ____________ Start time: ____________ Stop time: ____________ Movements: ____________ Date: ____________ Start time: ____________ Stop time: ____________ Movements: ____________ Date: ____________ Start time: ____________ Stop time: ____________ Movements: ____________ Date: ____________ Start time: ____________ Stop time: ____________ Movements: ____________ Date: ____________ Start time: ____________ Stop time: ____________ Movements: ____________ This information is not intended to replace advice given to you by your health care provider. Make sure you discuss any questions you have with your health care provider. Document Revised: 11/19/2018 Document Reviewed: 11/19/2018 Elsevier Patient Education  2021 [employer] Iron-Rich Diet  Iron is a mineral that helps your body to produce hemoglobin. Hemoglobin is a protein in red blood cells that carries oxygen to your body's tissues. Eating too little iron may cause you to feel weak and tired, and it can increase your risk of infection. Iron is naturally found in many foods, and many foods have iron added to them (iron-fortified foods). You may need to follow an iron-rich diet if you do not have enough iron in your body due to certain medical conditions. The amount of iron that you need each day depends on your age, your sex, and any medical conditions you have. Follow instructions from your health care provider or a diet and nutrition specialist (dietitian) about how much iron you should eat each day. What are tips for following this plan? Reading food labels  Check food labels to see how many milligrams (mg) of iron are in each  serving. Cooking  Cook foods in pots and pans that are made from iron.  Take these steps to make it easier for your body to absorb iron from certain  foods: ? Soak beans overnight before cooking. ? Soak whole grains overnight and drain them before using. ? Ferment flours before baking, such as by using yeast in bread dough. Meal planning  When you eat foods that contain iron, you should eat them with foods that are high in vitamin C. These include oranges, peppers, tomatoes, potatoes, and mango. Vitamin C helps your body to absorb iron. General information  Take iron supplements only as told by your health care provider. An overdose of iron can be life-threatening. If you were prescribed iron supplements, take them with orange juice or a vitamin C supplement.  When you eat iron-fortified foods or take an iron supplement, you should also eat foods that naturally contain iron, such as meat, poultry, and fish. Eating naturally iron-rich foods helps your body to absorb the iron that is added to other foods or contained in a supplement.  Certain foods and drinks prevent your body from absorbing iron properly. Avoid eating these foods in the same meal as iron-rich foods or with iron supplements. These foods include: ? Coffee, black tea, and red wine. ? Milk, dairy products, and foods that are high in calcium. ? Beans and soybeans. ? Whole grains. What foods should I eat? Fruits Prunes. Raisins. Eat fruits high in vitamin C, such as oranges, grapefruits, and strawberries, alongside iron-rich foods. Vegetables Spinach (cooked). Green peas. Broccoli. Fermented vegetables. Eat vegetables high in vitamin C, such as leafy greens, potatoes, bell peppers, and tomatoes, alongside iron-rich foods. Grains Iron-fortified breakfast cereal. Iron-fortified whole-wheat bread. Enriched rice. Sprouted grains. Meats and other proteins Beef liver. Oysters. Beef. Shrimp. [place]. Chicken. Tuna. Sardines. Chickpeas. Nuts. Tofu. Pumpkin seeds. Beverages Tomato juice. Fresh orange juice. Prune juice. Hibiscus tea. Fortified instant breakfast shakes. Sweets and  desserts Blackstrap molasses. Seasonings and condiments Tahini. Fermented soy sauce. Other foods Wheat germ. The items listed above may not be a complete list of recommended foods and beverages. Contact a dietitian for more information. What foods should I avoid? Grains Whole grains. Bran cereal. Bran flour. Oats. Meats and other proteins Soybeans. Products made from soy protein. Black beans. Lentils. Mung beans. Split peas. Dairy Milk. Cream. Cheese. Yogurt. Cottage cheese. Beverages Coffee. Black tea. Red wine. Sweets and desserts Cocoa. Chocolate. Ice cream. Other foods Basil. Oregano. Large amounts of parsley. The items listed above may not be a complete list of foods and beverages to avoid. Contact a dietitian for more information. Summary  Iron is a mineral that helps your body to produce hemoglobin. Hemoglobin is a protein in red blood cells that carries oxygen to your body's tissues.  Iron is naturally found in many foods, and many foods have iron added to them (iron-fortified foods).  When you eat foods that contain iron, you should eat them with foods that are high in vitamin C. Vitamin C helps your body to absorb iron.  Certain foods and drinks prevent your body from absorbing iron properly, such as whole grains and dairy products. You should avoid eating these foods in the same meal as iron-rich foods or with iron supplements. This information is not intended to replace advice given to you by your health care provider. Make sure you discuss any questions you have with your health care provider. Document Revised: 03/14/2017 Document Reviewed: 02/25/2017 Elsevier Patient Education  2021 Elsevier  Inc.  

## 2020-09-01 LAB — GLUCOSE TOLERANCE, 2 HOURS W/ 1HR
Glucose, 1 hour: 84 mg/dL (ref 65–179)
Glucose, 2 hour: 77 mg/dL (ref 65–152)
Glucose, Fasting: 80 mg/dL (ref 65–91)

## 2020-09-01 LAB — CBC
Hematocrit: 34.8 % (ref 34.0–46.6)
Hemoglobin: 11.5 g/dL (ref 11.1–15.9)
MCH: 31.8 pg (ref 26.6–33.0)
MCHC: 33 g/dL (ref 31.5–35.7)
MCV: 96 fL (ref 79–97)
Platelets: 206 10*3/uL (ref 150–450)
RBC: 3.62 x10E6/uL — ABNORMAL LOW (ref 3.77–5.28)
RDW: 12.9 % (ref 11.7–15.4)
WBC: 9.5 10*3/uL (ref 3.4–10.8)

## 2020-09-01 LAB — RPR: RPR Ser Ql: NONREACTIVE

## 2020-09-01 LAB — HIV ANTIBODY (ROUTINE TESTING W REFLEX): HIV Screen 4th Generation wRfx: NONREACTIVE

## 2020-09-25 ENCOUNTER — Other Ambulatory Visit: Payer: Self-pay

## 2020-09-25 ENCOUNTER — Encounter: Payer: Self-pay | Admitting: *Deleted

## 2020-09-25 ENCOUNTER — Other Ambulatory Visit: Payer: Self-pay | Admitting: *Deleted

## 2020-09-25 ENCOUNTER — Ambulatory Visit: Payer: BC Managed Care – PPO | Attending: Obstetrics and Gynecology

## 2020-09-25 ENCOUNTER — Ambulatory Visit: Payer: BC Managed Care – PPO | Admitting: *Deleted

## 2020-09-25 VITALS — BP 112/55 | HR 88

## 2020-09-25 DIAGNOSIS — Z3480 Encounter for supervision of other normal pregnancy, unspecified trimester: Secondary | ICD-10-CM | POA: Diagnosis present

## 2020-09-25 DIAGNOSIS — Z148 Genetic carrier of other disease: Secondary | ICD-10-CM

## 2020-09-25 DIAGNOSIS — O09892 Supervision of other high risk pregnancies, second trimester: Secondary | ICD-10-CM

## 2020-09-25 DIAGNOSIS — O0932 Supervision of pregnancy with insufficient antenatal care, second trimester: Secondary | ICD-10-CM | POA: Insufficient documentation

## 2020-09-25 DIAGNOSIS — Z3A32 32 weeks gestation of pregnancy: Secondary | ICD-10-CM | POA: Diagnosis not present

## 2020-09-25 DIAGNOSIS — O09299 Supervision of pregnancy with other poor reproductive or obstetric history, unspecified trimester: Secondary | ICD-10-CM | POA: Insufficient documentation

## 2020-09-25 DIAGNOSIS — O34219 Maternal care for unspecified type scar from previous cesarean delivery: Secondary | ICD-10-CM | POA: Insufficient documentation

## 2020-09-25 DIAGNOSIS — Z141 Cystic fibrosis carrier: Secondary | ICD-10-CM | POA: Diagnosis present

## 2020-09-25 DIAGNOSIS — O09293 Supervision of pregnancy with other poor reproductive or obstetric history, third trimester: Secondary | ICD-10-CM | POA: Diagnosis not present

## 2020-09-28 ENCOUNTER — Other Ambulatory Visit: Payer: Self-pay

## 2020-09-28 ENCOUNTER — Ambulatory Visit (INDEPENDENT_AMBULATORY_CARE_PROVIDER_SITE_OTHER): Payer: BC Managed Care – PPO | Admitting: Family Medicine

## 2020-09-28 ENCOUNTER — Telehealth: Payer: BC Managed Care – PPO | Admitting: Obstetrics and Gynecology

## 2020-09-28 VITALS — BP 108/65 | HR 98 | Wt 174.1 lb

## 2020-09-28 DIAGNOSIS — O09292 Supervision of pregnancy with other poor reproductive or obstetric history, second trimester: Secondary | ICD-10-CM

## 2020-09-28 DIAGNOSIS — O0932 Supervision of pregnancy with insufficient antenatal care, second trimester: Secondary | ICD-10-CM

## 2020-09-28 DIAGNOSIS — Z3480 Encounter for supervision of other normal pregnancy, unspecified trimester: Secondary | ICD-10-CM

## 2020-09-28 DIAGNOSIS — O34219 Maternal care for unspecified type scar from previous cesarean delivery: Secondary | ICD-10-CM

## 2020-09-28 LAB — POCT URINALYSIS DIP (DEVICE)
Bilirubin Urine: NEGATIVE
Glucose, UA: NEGATIVE mg/dL
Hgb urine dipstick: NEGATIVE
Ketones, ur: NEGATIVE mg/dL
Nitrite: NEGATIVE
Protein, ur: NEGATIVE mg/dL
Specific Gravity, Urine: 1.025 (ref 1.005–1.030)
Urobilinogen, UA: 0.2 mg/dL (ref 0.0–1.0)
pH: 7 (ref 5.0–8.0)

## 2020-09-28 NOTE — Progress Notes (Signed)
PRENATAL VISIT NOTE  Subjective:  Colleen Phelps is a 25 y.o. G2P1001 at [redacted]w[redacted]d being seen today for ongoing prenatal care.  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  Patient reports no complaints.  Contractions: Not present. Vag. Bleeding: None.  Movement: Present. Denies leaking of fluid.   The following portions of the patient's history were reviewed and updated as appropriate: allergies, current medications, past family history, past medical history, past social history, past surgical history and problem list.   Objective:   Vitals:   09/28/20 0958  BP: 108/65  Pulse: 98  Weight: 174 lb 1.6 oz (79 kg)    Fetal Status: Fetal Heart Rate (bpm): 160   Movement: Present     General:  Alert, oriented and cooperative. Patient is in no acute distress.  Skin: Skin is warm and dry. No rash noted.   Cardiovascular: Normal heart rate noted  Respiratory: Normal respiratory effort, no problems with respiration noted  Abdomen: Soft, gravid, appropriate for gestational age.  Pain/Pressure: Absent     Pelvic: Cervical exam deferred        Extremities: Normal range of motion.  Edema: None  Mental Status: Normal mood and affect. Normal behavior. Normal judgment and thought content.   Assessment and Plan:  Pregnancy: G2P1001 at [redacted]w[redacted]d 1. Supervision of other normal pregnancy, antepartum Up to date GDM screening negative TWG=39 lb 1.6 oz (17.7 kg)  which is above goal for pregnanct  2. History of cesarean delivery, currently pregnant   Patient with a history of c-section for arrest of descent with fetal  macrosomia  Current EFW is 2061g, 67th% at 32wks  We reviewed that a TOLAC is reasonable for those with history of one prior C-section.   Risk of uterine rupture at term is 0.78 percent with TOLAC and 0.22 percent with ERCD. 1 in 10 uterine ruptures will result in neonatal death or neurological injury. The benefits of a trial of labor after cesarean (TOLAC) resulting in a vaginal birth after cesarean (VBAC) include the following: shorter length of hospital stay and postpartum recovery (in most cases); fewer complications, such as postpartum fever, wound or uterine infection, thromboembolism (blood clots in the leg or lung), need for blood transfusion and fewer neonatal breathing problems. The risks of an attempted VBAC or TOLAC include the following: Risk of failed trial of labor after cesarean (TOLAC) without a vaginal birth after cesarean (VBAC) resulting in repeat cesarean delivery (RCD) in about 20 to 40 percent of women who attempt VBAC.  Risk of rupture of uterus resulting in an emergency cesarean delivery. The risk of uterine rupture may be related in part to the type of uterine incision made during the first cesarean delivery. A previous transverse uterine incision has the lowest risk of rupture (0.2 to 1.5 percent risk). Vertical or T-shaped uterine incisions have a higher risk of uterine rupture (4 to 9 percent risk)The risk of fetal death is very low with both VBAC and elective repeat cesarean delivery (ERCD), but the likelihood of fetal death is higher with VBAC than with ERCD. Maternal death is very rare with either type of delivery. The risks of an elective repeat cesarean delivery (ERCD) were reviewed with the  patient including but not limited to: 05/998 risk of uterine rupture which could have serious consequences, bleeding which may require transfusion; infection which may require antibiotics; injury to bowel, bladder or other surrounding organs (bowel, bladder, ureters); injury to the  fetus; need for additional procedures including hysterectomy in the event of a life-threatening hemorrhage; thromboembolic phenomenon; abnormal placentation; incisional problems; death and other postoperative or anesthesia complications.     These risks and benefits are summarized on the consent form, which was reviewed with the patient during the visit.  All her questions answered and she signed a consent indicating a preference for TOLAC. A copy of the consent was given to the patient.   Counseled regarding TOLAC vs RCS; risks/benefits discussed in detail. All questions answered.  Patient elects for TOLAC, consent signed 09/28/2020.   3. Hx of macrosomia in infant in prior pregnancy, currently pregnant, second trimester Last infant > 4000g  4. Late prenatal care affecting pregnancy in second trimester   Preterm labor symptoms and general obstetric precautions including but not limited to vaginal bleeding, contractions, leaking of fluid and fetal movement were reviewed in detail with the patient. Please refer to After Visit Summary for other counseling recommendations.   No follow-ups on file.  Future Appointments  Date Time Provider Department Center  10/26/2020  9:50 AM Gerrit Heck, CNM [facility] None  10/30/2020  8:30 AM [facility] NURSE [facility]  10/30/2020  8:45 AM [facility] US4 [facility]  11/09/2020 10:50 AM Raelyn Mora, CNM [facility] None    Federico Flake, MD

## 2020-09-29 ENCOUNTER — Encounter: Payer: Self-pay | Admitting: *Deleted

## 2020-10-13 ENCOUNTER — Telehealth (INDEPENDENT_AMBULATORY_CARE_PROVIDER_SITE_OTHER): Payer: BC Managed Care – PPO

## 2020-10-13 VITALS — BP 105/65 | HR 80 | Wt 176.5 lb

## 2020-10-13 DIAGNOSIS — Z141 Cystic fibrosis carrier: Secondary | ICD-10-CM

## 2020-10-13 DIAGNOSIS — O34219 Maternal care for unspecified type scar from previous cesarean delivery: Secondary | ICD-10-CM

## 2020-10-13 DIAGNOSIS — O0933 Supervision of pregnancy with insufficient antenatal care, third trimester: Secondary | ICD-10-CM

## 2020-10-13 DIAGNOSIS — O0932 Supervision of pregnancy with insufficient antenatal care, second trimester: Secondary | ICD-10-CM

## 2020-10-13 DIAGNOSIS — O09892 Supervision of other high risk pregnancies, second trimester: Secondary | ICD-10-CM

## 2020-10-13 DIAGNOSIS — Z3480 Encounter for supervision of other normal pregnancy, unspecified trimester: Secondary | ICD-10-CM

## 2020-10-13 DIAGNOSIS — Z3A34 34 weeks gestation of pregnancy: Secondary | ICD-10-CM

## 2020-10-13 NOTE — Progress Notes (Signed)
OBSTETRICS PRENATAL VIRTUAL VISIT ENCOUNTER NOTE  Provider location: [facility]   Patient location: Home  I connected with Colleen Phelps on 10/13/20 at  9:50 AM EDT by MyChart Video Encounter and verified that I am speaking with the correct person using two identifiers. I discussed the limitations, risks, security and privacy concerns of performing an evaluation and management service virtually and the availability of in person appointments. I also discussed with the patient that there may be a patient responsible charge related to this service. The patient expressed understanding and agreed to proceed. Subjective:  Colleen Phelps is a 25 y.o. G2P1001 at [redacted]w[redacted]d being seen today for ongoing prenatal care.  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  Patient reports  pressure .  Contractions: Not present. Vag. Bleeding: None.  Movement: Present. Denies any leaking of fluid. She denies constipation or diarrhea.  She also denies pain or difficulty with urination.    The following portions of the patient's history were reviewed and updated as appropriate: allergies, current medications, past family history, past medical history, past social history, past surgical history and problem list.   Objective:   Vitals:   10/13/20 0957  BP: 105/65  Pulse: 80  Weight: 176 lb 8 oz (80.1 kg)    Fetal Status:     Movement: Present     General:  Alert, oriented and cooperative. Patient is in no acute distress.  Respiratory: Normal respiratory effort, no problems with respiration noted  Mental Status:  Normal mood and affect. Normal behavior. Normal judgment and thought content.  Rest of physical exam deferred due to type of encounter  Imaging: [place] MFM OB FOLLOW UP  Result Date: 09/25/2020 ----------------------------------------------------------------------  OBSTETRICS REPORT                       (Signed Final 09/25/2020 09:17 am) ---------------------------------------------------------------------- Patient Info  ID #:       [id number]                          D.O.B.:  1995-10-24 (25 yrs)  Name:       Colleen Phelps                Visit Date: 09/25/2020 07:58 am ---------------------------------------------------------------------- Performed By  Attending:        Noralee Space MD        Ref. Address:     [address]  Performed By:     Fayne Norrie BS,      [facility]  Referred By:      Judeth Horn                    CNM ---------------------------------------------------------------------- Orders  #  Description                           Code        Ordered By  1  [place] MFM OB FOLLOW UP                   [phone]    Noralee Space ----------------------------------------------------------------------  #  Order #                     Accession #                Episode #  1  [id number]                   [id number]                 [id number] ---------------------------------------------------------------------- Indications  Poor obstetric history: Prior fetal            O09.299  macrosomia, antepartum  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 Cord Insertion:      Previously Visualized  Amniotic Fluid  AFI FV:      Within normal limits  AFI Sum(cm)     %Tile       Largest Pocket(cm)  11.54           28          4.28  RUQ(cm)       RLQ(cm)       LUQ(cm)        LLQ(cm)  2.74          4.03          4.28           0.49 ---------------------------------------------------------------------- Biometry  BPD:      82.6  mm     G. Age:  33w 2d         77  %  CI:        74.82   %    70 - 86                                                          FL/HC:      20.0   %    19.1 - 21.3  HC:       303   mm     G. Age:  33w 5d         58  %    HC/AC:      1.03        0.96 - 1.17  AC:      293.4  mm     G. Age:  33w 2d         84  %    FL/BPD:     73.5   %    71 - 87  FL:       60.7  mm     G. Age:  31w 4d         25  %    FL/AC:      20.7   %    20 - 24  LV:        5.2  mm  Est. FW:    2061  gm      4 lb 9 oz     67  % ---------------------------------------------------------------------- OB History  Gravidity:    2         Term:   1        Prem:   0        SAB:   0  TOP:          0       Ectopic:  0        Living: 1 ---------------------------------------------------------------------- Gestational Age  LMP:           39w 4d        Date:  12/23/19                 EDD:   09/28/20  U/S Today:     33w 0d                                        EDD:   11/13/20  Best:          32w 0d     Det. By:  U/S  (06/05/20)          EDD:   11/20/20 ---------------------------------------------------------------------- Anatomy  Cranium:               Appears normal         Aortic Arch:            Previously seen  Cavum:                 Appears normal         Ductal Arch:            Previously seen  Ventricles:            Appears normal          Diaphragm:  Previously seen  Choroid Plexus:        Previously seen        Stomach:                Appears normal, left                                                                        sided  Cerebellum:            Previously seen        Abdomen:                Appears normal  Posterior Fossa:       Previously seen        Abdominal Wall:         Previously seen  Nuchal Fold:           Previously seen        Cord Vessels:           Appears normal (3                                                                        vessel cord)  Face:                  Orbits and profile     Kidneys:                Appear normal                         previously seen  Lips:                  Previously seen        Bladder:                Appears normal  Thoracic:              Appears normal         Spine:                  Previously seen                         Appears normal  Heart:                 Appears normal         Upper Extremities:      Previously seen                         (4CH, axis, and                         situs)  RVOT:                  Previously seen        Lower Extremities:  Previously seen  LVOT:                  Previously seen  Other:  SVC, IVC, 3VV/T, Heels, 5th digit, and Nasal bone previously          visualized. Fetus appears to be female. ---------------------------------------------------------------------- Cervix Uterus Adnexa  Cervix  Not visualized (advanced GA >24wks) ---------------------------------------------------------------------- Impression  Patient returned for fetal growth assessment . Previous child  weighed 9 lbs at birth. She does not have gestational  diabetes.  Fetal growth is appropriate for gestational age .Amniotic fluid  is normal and good fetal activity is seen .  I reassured the patient of the findings.  Patient had questions  about VBAC.  I explained the benefits and risks of VBAC  including scar dehiscence and 1% of cases.  We will perform  fetal  growth assessment in 5 weeks  to estimated fetal weight. ---------------------------------------------------------------------- Recommendations  -An appointment was made for her to return in 5 weeks for  fetal growth assessment. ----------------------------------------------------------------------                  Noralee Spaceavi Shankar, MD Electronically Signed Final Report   09/25/2020 09:17 am ----------------------------------------------------------------------   Assessment and Plan:  Pregnancy: G2P1001 at 234w4d 1. Supervision of other normal pregnancy, antepartum -Anticipatory guidance for upcoming appts. -Patient to schedule next appt in 2 weeks for an in-person visit.  2. History of cesarean delivery, currently pregnant -Spoke with Dr. Alvester MorinNewton on June 13 and TOLAC consent signed.  -States she feels everything was explained well and has no questions.  3. [redacted] weeks gestation of pregnancy -Doing well overall. -Reassured that pressure is normal in pregnancy. -Encouraged usage of maternity belt.   Preterm labor symptoms and general obstetric precautions including but not limited to vaginal bleeding, contractions, leaking of fluid and fetal movement were reviewed in detail with the patient. I discussed the assessment and treatment plan with the patient. The patient was provided an opportunity to ask questions and all were answered. The patient agreed with the plan and demonstrated an understanding of the instructions. The patient was advised to call back or seek an in-person office evaluation/[facility] for any urgent or concerning symptoms. Please refer to After Visit Summary for other counseling recommendations.   I provided 6 minutes of face-to-face time during this encounter.  Return in about 2 weeks (around 10/27/2020).  Future Appointments  Date Time Provider Department Center  10/26/2020  9:50 AM Gerrit HeckEmly, Dashon Mcintire, CNM [facility] None  10/30/2020  8:30 AM [facility]  10/30/2020  8:45 AM [facility]  11/09/2020 10:50 AM Raelyn Moraawson, Rolitta, CNM [facility] None  11/15/2020  9:10 AM Calvert CantorWeinhold, Samantha C, CNM [facility] None  11/22/2020  9:10 AM Bernerd LimboWalker, Jamilla R, CNM [facility] None    Cherre RobinsJessica L Ceyda Peterka, CNM [facility]

## 2020-10-26 ENCOUNTER — Other Ambulatory Visit: Payer: Self-pay

## 2020-10-26 ENCOUNTER — Other Ambulatory Visit (HOSPITAL_COMMUNITY)
Admission: RE | Admit: 2020-10-26 | Discharge: 2020-10-26 | Disposition: A | Discharge status: Home / Self Care | Payer: BC Managed Care – PPO | Source: Ambulatory Visit

## 2020-10-26 ENCOUNTER — Ambulatory Visit (INDEPENDENT_AMBULATORY_CARE_PROVIDER_SITE_OTHER): Payer: BC Managed Care – PPO

## 2020-10-26 VITALS — BP 115/76 | HR 99 | Temp 98.2°F | Wt 175.0 lb

## 2020-10-26 DIAGNOSIS — O26899 Other specified pregnancy related conditions, unspecified trimester: Secondary | ICD-10-CM

## 2020-10-26 DIAGNOSIS — Z3480 Encounter for supervision of other normal pregnancy, unspecified trimester: Secondary | ICD-10-CM

## 2020-10-26 DIAGNOSIS — N898 Other specified noninflammatory disorders of vagina: Secondary | ICD-10-CM | POA: Diagnosis present

## 2020-10-26 DIAGNOSIS — Z3A36 36 weeks gestation of pregnancy: Secondary | ICD-10-CM

## 2020-10-26 NOTE — Progress Notes (Signed)
   HIGH-RISK PREGNANCY OFFICE VISIT  Patient name: Colleen Phelps MRN [id number]  Date of birth: [birth year] Chief Complaint:   Routine Prenatal Visit  Subjective:   Colleen Phelps is a 25 y.o. 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  Patient presents today with  vaginal discharge .  Patient endorses fetal movement. Patient denies abdominal cramping or contractions.  Patient reports vaginal concerns in the form of abnormal discharge.  However, she denies irritation, odor,  leaking of fluid, and bleeding.  Contractions: Not present. Vag. Bleeding: None.  Movement: Present.  Reviewed past medical,surgical, social, obstetrical and family history as well as problem list, medications and allergies.  Objective   Vitals:   10/26/20 1000  BP: 115/76  Pulse: 99  Temp: 98.2 F (36.8 C)  Weight: 175 lb (79.4 kg)  Body mass index is 31 kg/m.  Total Weight Gain:40 lb (18.1 kg)         Physical Examination:   General appearance: Well appearing, and in no distress  Mental status: Alert, oriented to person, place, and time  Skin: Warm & dry  Cardiovascular: Normal heart rate noted  Respiratory: Normal respiratory effort, no distress  Abdomen: Soft, gravid, nontender, AGA with Fundal Height: 37 cm  Pelvic: Cervical exam performed  Dilation: 1 Effacement (%): 50 Station: Ballotable Presentation: Vertex  Extremities: Edema: None  Fetal Status: Fetal Heart Rate (bpm): 149  Movement: Present   No results found for this or any previous visit (from the past 24 hour(s)).   Assessment & Plan:  High-risk pregnancy of a 25 y.o., G2P1001 at [redacted]w[redacted]d with an Estimated Date of Delivery: 11/20/20   1. Supervision of other normal pregnancy, antepartum -Anticipatory guidance for upcoming appts. -Patient to schedule next appt in 1 weeks for an in-person visit.  2. Vaginal discharge during pregnancy, antepartum -CV collected  3. [redacted] weeks gestation of pregnancy -Doing well. -Discussed desire for TOLAC. -Patient states she will truly determine after growth [place] if TOLAC will be appropriate. -Labs as below. -Educated on GBS bacteria including what it is, why we test, and how and when we treat if needed. -Discussed releasing of results to mychart.     Meds: No orders of the defined types were placed in this encounter.  Labs/procedures today: Lab Orders  Culture, beta strep (group b only)    Reviewed: Term labor symptoms and general obstetric precautions including but not limited to vaginal bleeding, contractions, leaking of fluid and fetal movement were reviewed in detail with the patient.  All questions were answered.  Follow-up: No follow-ups on file.  Orders Placed This Encounter  Procedures   Culture, beta strep (group b only)   Cherre Robins MSN, CNM 10/26/2020

## 2020-10-27 LAB — CERVICOVAGINAL ANCILLARY ONLY
Bacterial Vaginitis (gardnerella): NEGATIVE
Candida Glabrata: NEGATIVE
Candida Vaginitis: NEGATIVE
Chlamydia: NEGATIVE
Comment: NEGATIVE
Comment: NEGATIVE
Comment: NEGATIVE
Comment: NEGATIVE
Comment: NEGATIVE
Comment: NORMAL
Neisseria Gonorrhea: NEGATIVE
Trichomonas: NEGATIVE

## 2020-10-30 ENCOUNTER — Encounter: Payer: Self-pay | Admitting: *Deleted

## 2020-10-30 ENCOUNTER — Ambulatory Visit: Payer: BC Managed Care – PPO | Admitting: *Deleted

## 2020-10-30 ENCOUNTER — Other Ambulatory Visit: Payer: Self-pay

## 2020-10-30 ENCOUNTER — Ambulatory Visit: Payer: BC Managed Care – PPO | Attending: Obstetrics and Gynecology

## 2020-10-30 VITALS — BP 103/57 | HR 83

## 2020-10-30 DIAGNOSIS — Z141 Cystic fibrosis carrier: Secondary | ICD-10-CM

## 2020-10-30 DIAGNOSIS — O09892 Supervision of other high risk pregnancies, second trimester: Secondary | ICD-10-CM

## 2020-10-30 DIAGNOSIS — Z148 Genetic carrier of other disease: Secondary | ICD-10-CM | POA: Diagnosis not present

## 2020-10-30 DIAGNOSIS — O34219 Maternal care for unspecified type scar from previous cesarean delivery: Secondary | ICD-10-CM | POA: Insufficient documentation

## 2020-10-30 DIAGNOSIS — O09293 Supervision of pregnancy with other poor reproductive or obstetric history, third trimester: Secondary | ICD-10-CM

## 2020-10-30 DIAGNOSIS — O09299 Supervision of pregnancy with other poor reproductive or obstetric history, unspecified trimester: Secondary | ICD-10-CM | POA: Diagnosis present

## 2020-10-30 DIAGNOSIS — Z3480 Encounter for supervision of other normal pregnancy, unspecified trimester: Secondary | ICD-10-CM

## 2020-10-30 DIAGNOSIS — O0932 Supervision of pregnancy with insufficient antenatal care, second trimester: Secondary | ICD-10-CM | POA: Insufficient documentation

## 2020-10-30 DIAGNOSIS — Z362 Encounter for other antenatal screening follow-up: Secondary | ICD-10-CM | POA: Diagnosis not present

## 2020-10-30 DIAGNOSIS — Z3A38 38 weeks gestation of pregnancy: Secondary | ICD-10-CM

## 2020-10-30 LAB — CULTURE, BETA STREP (GROUP B ONLY): Strep Gp B Culture: NEGATIVE

## 2020-11-02 ENCOUNTER — Encounter: Payer: Self-pay | Admitting: Obstetrics and Gynecology

## 2020-11-02 ENCOUNTER — Ambulatory Visit (INDEPENDENT_AMBULATORY_CARE_PROVIDER_SITE_OTHER): Payer: BC Managed Care – PPO | Admitting: Obstetrics and Gynecology

## 2020-11-02 ENCOUNTER — Other Ambulatory Visit: Payer: Self-pay

## 2020-11-02 VITALS — BP 104/67 | HR 94 | Temp 97.5°F | Wt 175.0 lb

## 2020-11-02 DIAGNOSIS — O09293 Supervision of pregnancy with other poor reproductive or obstetric history, third trimester: Secondary | ICD-10-CM

## 2020-11-02 DIAGNOSIS — Z3A37 37 weeks gestation of pregnancy: Secondary | ICD-10-CM

## 2020-11-02 DIAGNOSIS — Z3480 Encounter for supervision of other normal pregnancy, unspecified trimester: Secondary | ICD-10-CM

## 2020-11-02 DIAGNOSIS — O34219 Maternal care for unspecified type scar from previous cesarean delivery: Secondary | ICD-10-CM

## 2020-11-02 NOTE — Progress Notes (Addendum)
   LOW-RISK PREGNANCY OFFICE VISIT Patient name: Colleen Phelps MRN [id number]  Date of birth: 11/13/1995 Chief Complaint:   Routine Prenatal Visit  History of Present Illness:   Colleen Phelps is a 25 y.o. G37P1001 female at [redacted]w[redacted]d with an Estimated Date of Delivery: 11/20/20 being seen today for ongoing management of a low-risk pregnancy.  Today she reports occasional contractions and increased pressure in tailbone. Contractions: Irritability. Vag. Bleeding: None.  Movement: Present. denies leaking of fluid. Review of Systems:   Pertinent items are noted in HPI Denies abnormal vaginal discharge w/ itching/odor/irritation, headaches, visual changes, shortness of breath, chest pain, abdominal pain, severe nausea/vomiting, or problems with urination or bowel movements unless otherwise stated above. Pertinent History Reviewed:  Reviewed past medical,surgical, social, obstetrical and family history.  Reviewed problem list, medications and allergies. Physical Assessment:   Vitals:   11/02/20 0936  BP: 104/67  Pulse: 94  Temp: (!) 97.5 F (36.4 C)  Weight: 175 lb (79.4 kg)  Body mass index is 31 kg/m.        Physical Examination:   General appearance: Well appearing, and in no distress  Mental status: Alert, oriented to person, place, and time  Skin: Warm & dry  Cardiovascular: Normal heart rate noted  Respiratory: Normal respiratory effort, no distress  Abdomen: Soft, gravid, nontender  Pelvic: Cervical exam deferred         Extremities: Edema: None  Fetal Status: Fetal Heart Rate (bpm): 140 Fundal Height: 38 cm Movement: Present Presentation: Vertex  No results found for this or any previous visit (from the past 24 hour(s)).  Assessment & Plan:  1) Low-risk pregnancy G2P1001 at [redacted]w[redacted]d with an Estimated Date of Delivery: 11/20/20   2) Supervision of other normal pregnancy, antepartum - Baby suspected to be in OP position with Leopold's maneuvers today - Explained that this could  be the cause of her tailbone pressure  3) History of cesarean delivery, currently pregnant - Planning TOLAC  4) Hx of macrosomia in infant in prior pregnancy, currently pregnant, third trimester - Concerned this baby will be that big or bigger - Considering RCS, but still would like to attempt labor  5) [redacted] weeks gestation of pregnancy   Meds: No orders of the defined types were placed in this encounter.  Labs/procedures today: none  Plan:  Continue routine obstetrical care   Reviewed: Term labor symptoms and general obstetric precautions including but not limited to vaginal bleeding, contractions, leaking of fluid and fetal movement were reviewed in detail with the patient.  All questions were answered. Has home bp cuff. Check bp weekly, let us know if >140/90.   Follow-up: Return in about 1 week (around 11/09/2020) for Return OB visit.  No orders of the defined types were placed in this encounter.  Raelyn Mora MSN, CNM 11/02/2020 9:54 AM

## 2020-11-09 ENCOUNTER — Ambulatory Visit (INDEPENDENT_AMBULATORY_CARE_PROVIDER_SITE_OTHER): Payer: BC Managed Care – PPO | Admitting: Obstetrics and Gynecology

## 2020-11-09 ENCOUNTER — Other Ambulatory Visit: Payer: Self-pay

## 2020-11-09 ENCOUNTER — Other Ambulatory Visit: Payer: Self-pay | Admitting: Obstetrics and Gynecology

## 2020-11-09 ENCOUNTER — Encounter: Payer: Self-pay | Admitting: Obstetrics and Gynecology

## 2020-11-09 VITALS — BP 111/74 | HR 98 | Temp 97.7°F | Wt 175.8 lb

## 2020-11-09 DIAGNOSIS — O34219 Maternal care for unspecified type scar from previous cesarean delivery: Secondary | ICD-10-CM

## 2020-11-09 DIAGNOSIS — Z3A38 38 weeks gestation of pregnancy: Secondary | ICD-10-CM

## 2020-11-09 DIAGNOSIS — O09293 Supervision of pregnancy with other poor reproductive or obstetric history, third trimester: Secondary | ICD-10-CM

## 2020-11-09 DIAGNOSIS — Z3480 Encounter for supervision of other normal pregnancy, unspecified trimester: Secondary | ICD-10-CM

## 2020-11-09 NOTE — Progress Notes (Signed)
IOL Orders entered

## 2020-11-09 NOTE — Progress Notes (Signed)
   LOW-RISK PREGNANCY OFFICE VISIT Patient name: Colleen Phelps MRN [id number]  Date of birth: [birth year] Chief Complaint:   Routine Prenatal Visit  History of Present Illness:   Colleen Phelps is a 25 y.o. G72P1001 female at [redacted]w[redacted]d with an Estimated Date of Delivery: 11/20/20 being seen today for ongoing management of a low-risk pregnancy.  Today she reports occasional contractions and increased pelvic pressure. Contractions: Irregular. Vag. Bleeding: None.  Movement: Present. denies leaking of fluid. Review of Systems:   Pertinent items are noted in HPI Denies abnormal vaginal discharge w/ itching/odor/irritation, headaches, visual changes, shortness of breath, chest pain, abdominal pain, severe nausea/vomiting, or problems with urination or bowel movements unless otherwise stated above. Pertinent History Reviewed:  Reviewed past medical,surgical, social, obstetrical and family history.  Reviewed problem list, medications and allergies. Physical Assessment:   Vitals:   11/09/20 1117  BP: 111/74  Pulse: 98  Temp: 97.7 F (36.5 C)  Weight: 175 lb 12.8 oz (79.7 kg)  Body mass index is 31.14 kg/m.        Physical Examination:   General appearance: Well appearing, and in no distress  Mental status: Alert, oriented to person, place, and time  Skin: Warm & dry  Cardiovascular: Normal heart rate noted  Respiratory: Normal respiratory effort, no distress  Abdomen: Soft, gravid, nontender  Pelvic: Cervical exam performed  Dilation: 2 Effacement (%): 60 Station: -2  Extremities: Edema: None  Fetal Status: Fetal Heart Rate (bpm): 140 Fundal Height: 38 cm Movement: Present Presentation: Vertex  No results found for this or any previous visit (from the past 24 hour(s)).  Assessment & Plan:  1) Low-risk pregnancy G2P1001 at [redacted]w[redacted]d with an Estimated Date of Delivery: 11/20/20   2) Supervision of other normal pregnancy, antepartum - Patient desires to be scheduled for IOL after EDC. - Still  desires TOLAC, but concerned she will not be successful  3) History of cesarean delivery, currently pregnant  4) Hx of macrosomia in infant in prior pregnancy, currently pregnant, third trimester  5) [redacted] weeks gestation of pregnancy    Meds: No orders of the defined types were placed in this encounter.  Labs/procedures today: cervical exam  Plan:  Continue routine obstetrical care   Reviewed: Term labor symptoms and general obstetric precautions including but not limited to vaginal bleeding, contractions, leaking of fluid and fetal movement were reviewed in detail with the patient.  All questions were answered. Has home bp cuff. Check bp weekly, let us know if >140/90.   Follow-up: Return in about 1 week (around 11/16/2020) for Return OB visit.  No orders of the defined types were placed in this encounter.  Raelyn Mora MSN, CNM 11/09/2020 11:32 AM

## 2020-11-13 ENCOUNTER — Telehealth (HOSPITAL_COMMUNITY): Payer: Self-pay | Admitting: *Deleted

## 2020-11-13 NOTE — Telephone Encounter (Signed)
Preadmission screen  

## 2020-11-14 ENCOUNTER — Encounter (HOSPITAL_COMMUNITY): Payer: Self-pay | Admitting: *Deleted

## 2020-11-14 ENCOUNTER — Telehealth (HOSPITAL_COMMUNITY): Payer: Self-pay | Admitting: *Deleted

## 2020-11-14 ENCOUNTER — Other Ambulatory Visit: Payer: Self-pay | Admitting: Advanced Practice Midwife

## 2020-11-14 NOTE — Telephone Encounter (Signed)
Preadmission screen  

## 2020-11-15 ENCOUNTER — Ambulatory Visit (INDEPENDENT_AMBULATORY_CARE_PROVIDER_SITE_OTHER): Payer: BC Managed Care – PPO | Admitting: Advanced Practice Midwife

## 2020-11-15 ENCOUNTER — Other Ambulatory Visit: Payer: Self-pay

## 2020-11-15 VITALS — BP 116/74 | HR 87 | Temp 97.7°F | Wt 176.0 lb

## 2020-11-15 DIAGNOSIS — Z3A39 39 weeks gestation of pregnancy: Secondary | ICD-10-CM

## 2020-11-15 DIAGNOSIS — O09293 Supervision of pregnancy with other poor reproductive or obstetric history, third trimester: Secondary | ICD-10-CM

## 2020-11-15 DIAGNOSIS — O34219 Maternal care for unspecified type scar from previous cesarean delivery: Secondary | ICD-10-CM

## 2020-11-15 DIAGNOSIS — Z3480 Encounter for supervision of other normal pregnancy, unspecified trimester: Secondary | ICD-10-CM

## 2020-11-15 NOTE — Progress Notes (Signed)
   PRENATAL VISIT NOTE  Subjective:  Colleen Phelps is a 25 y.o. G2P1001 at [redacted]w[redacted]d being seen today for ongoing prenatal care.  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  Patient reports occasional contractions.  Contractions: Irregular. Vag. Bleeding: None.  Movement: Present. Denies leaking of fluid.   The following portions of the patient's history were reviewed and updated as appropriate: allergies, current medications, past family history, past medical history, past social history, past surgical history and problem list. Problem list updated.  Objective:   Vitals:   11/15/20 0915  BP: 116/74  Pulse: 87  Temp: 97.7 F (36.5 C)  Weight: 176 lb (79.8 kg)    Fetal Status: Fetal Heart Rate (bpm): 150 Fundal Height: 39 cm Movement: Present  Presentation: Vertex  General:  Alert, oriented and cooperative. Patient is in no acute distress.  Skin: Skin is warm and dry. No rash noted.   Cardiovascular: Normal heart rate noted  Respiratory: Normal respiratory effort, no problems with respiration noted  Abdomen: Soft, gravid, appropriate for gestational age.  Pain/Pressure: Present     Pelvic: Cervical exam performed Dilation: 2 Effacement (%): 60 Station: -3  Extremities: Normal range of motion.  Edema: None  Mental Status: Normal mood and affect. Normal behavior. Normal judgment and thought content.   Assessment and Plan:  Pregnancy: G2P1001 at [redacted]w[redacted]d  1. Supervision of other normal pregnancy, antepartum --Discussed membrane sweeping today. Reviewed cochrane review data on membrane sweeping at 39 wks and then at EDD.  Reviewed risk of cramping, contractions, bleeding and ROM. Answered patient questions and she agreed to proceed with procedure.  --Cervical os very posterior. Pt unable to tolerate contact with internal os. Membrane sweep attempt discontinued. Pt very tearful that this means she will not be able to [facility]. Reassurance provided  --IOL previously scheduled for 08/09. Based on today's exam, advised foley balloon placement in conjunction with low dose Pitocin. Discussed data on high success rate of this pairing. Pt agreeable  2. History of cesarean delivery, currently pregnant - TOLAC consent signed 09/28/2020  3. Hx of macrosomia in infant in prior pregnancy, currently pregnant, third trimester - P1 4120 g - Current pregnancy 3519g on 07/18 at 37 weeks, extrapolates to  4. [redacted] weeks gestation of pregnancy   Term labor symptoms and general obstetric precautions including but not limited to vaginal bleeding, contractions, leaking of fluid and fetal movement were reviewed in detail with the patient. Please refer to After Visit Summary for other counseling recommendations.  Return in about 4 weeks (around 12/13/2020) for Postpartum visit.  Future Appointments  Date Time Provider Department Center  11/21/2020  6:30 AM [facility] SCHED ROOM [facility] None  12/20/2020  9:55 AM Bernerd Limbo, CNM [facility] None    [place]vert [place], [place]

## 2020-11-20 ENCOUNTER — Other Ambulatory Visit: Payer: Self-pay | Admitting: Obstetrics & Gynecology

## 2020-11-20 LAB — SARS CORONAVIRUS 2 (TAT 6-24 HRS): SARS Coronavirus 2: NEGATIVE

## 2020-11-21 ENCOUNTER — Inpatient Hospital Stay (HOSPITAL_COMMUNITY): Payer: BC Managed Care – PPO | Admitting: Anesthesiology

## 2020-11-21 ENCOUNTER — Inpatient Hospital Stay (HOSPITAL_COMMUNITY): Payer: BC Managed Care – PPO | Attending: Obstetrics & Gynecology

## 2020-11-21 ENCOUNTER — Encounter (HOSPITAL_COMMUNITY): Payer: Self-pay | Admitting: Obstetrics & Gynecology

## 2020-11-21 ENCOUNTER — Inpatient Hospital Stay (HOSPITAL_COMMUNITY)
Admission: AD | Admit: 2020-11-21 | Discharge: 2020-11-23 | DRG: 807 | Disposition: A | Discharge status: Home / Self Care | Payer: BC Managed Care – PPO | Attending: Obstetrics & Gynecology | Admitting: Obstetrics & Gynecology

## 2020-11-21 ENCOUNTER — Other Ambulatory Visit: Payer: Self-pay

## 2020-11-21 ENCOUNTER — Inpatient Hospital Stay (HOSPITAL_COMMUNITY)
Admission: AD | Admit: 2020-11-21 | Payer: BC Managed Care – PPO | Source: Home / Self Care | Admitting: Obstetrics & Gynecology

## 2020-11-21 DIAGNOSIS — Z3A40 40 weeks gestation of pregnancy: Secondary | ICD-10-CM | POA: Diagnosis not present

## 2020-11-21 DIAGNOSIS — O134 Gestational [pregnancy-induced] hypertension without significant proteinuria, complicating childbirth: Principal | ICD-10-CM | POA: Diagnosis present

## 2020-11-21 DIAGNOSIS — O34219 Maternal care for unspecified type scar from previous cesarean delivery: Secondary | ICD-10-CM | POA: Diagnosis present

## 2020-11-21 DIAGNOSIS — Z3480 Encounter for supervision of other normal pregnancy, unspecified trimester: Secondary | ICD-10-CM

## 2020-11-21 DIAGNOSIS — Z141 Cystic fibrosis carrier: Secondary | ICD-10-CM

## 2020-11-21 DIAGNOSIS — Z3490 Encounter for supervision of normal pregnancy, unspecified, unspecified trimester: Secondary | ICD-10-CM

## 2020-11-21 DIAGNOSIS — O09892 Supervision of other high risk pregnancies, second trimester: Secondary | ICD-10-CM

## 2020-11-21 DIAGNOSIS — O26893 Other specified pregnancy related conditions, third trimester: Secondary | ICD-10-CM | POA: Diagnosis present

## 2020-11-21 DIAGNOSIS — O34211 Maternal care for low transverse scar from previous cesarean delivery: Secondary | ICD-10-CM | POA: Diagnosis not present

## 2020-11-21 DIAGNOSIS — O99214 Obesity complicating childbirth: Secondary | ICD-10-CM | POA: Diagnosis present

## 2020-11-21 DIAGNOSIS — O0932 Supervision of pregnancy with insufficient antenatal care, second trimester: Secondary | ICD-10-CM

## 2020-11-21 LAB — TYPE AND SCREEN
ABO/RH(D): O POS
Antibody Screen: NEGATIVE

## 2020-11-21 LAB — CBC
HCT: 36.8 % (ref 36.0–46.0)
Hemoglobin: 12 g/dL (ref 12.0–15.0)
MCH: 29.6 pg (ref 26.0–34.0)
MCHC: 32.6 g/dL (ref 30.0–36.0)
MCV: 90.9 fL (ref 80.0–100.0)
Platelets: 188 10*3/uL (ref 150–400)
RBC: 4.05 MIL/uL (ref 3.87–5.11)
RDW: 13.8 % (ref 11.5–15.5)
WBC: 9.8 10*3/uL (ref 4.0–10.5)
nRBC: 0 % (ref 0.0–0.2)

## 2020-11-21 MED ORDER — OXYTOCIN-SODIUM CHLORIDE 30-0.9 UT/500ML-% IV SOLN: 2.5000 [IU]/h | INTRAVENOUS | Status: DC

## 2020-11-21 MED ORDER — EPHEDRINE 5 MG/ML INJ: 10.0000 mg | INTRAVENOUS | Status: DC | PRN

## 2020-11-21 MED ORDER — LACTATED RINGERS IV SOLN
500.0000 mL | Freq: Once | INTRAVENOUS | Status: AC
  Administered 2020-11-21: 500 mL via INTRAVENOUS

## 2020-11-21 MED ORDER — ONDANSETRON HCL 4 MG/2ML IJ SOLN
4.0000 mg | Freq: Four times a day (QID) | INTRAMUSCULAR | Status: DC | PRN
Start: 2020-11-21 — End: 2020-11-22

## 2020-11-21 MED ORDER — LIDOCAINE HCL (PF) 1 % IJ SOLN
30.0000 mL | INTRAMUSCULAR | Status: DC | PRN
Start: 2020-11-21 — End: 2020-11-22

## 2020-11-21 MED ORDER — DIPHENHYDRAMINE HCL 50 MG/ML IJ SOLN
12.5000 mg | INTRAMUSCULAR | Status: DC | PRN
Start: 2020-11-21 — End: 2020-11-22

## 2020-11-21 MED ORDER — FENTANYL-BUPIVACAINE-NACL 0.5-0.125-0.9 MG/250ML-% EP SOLN
12.0000 mL/h | EPIDURAL | Status: DC | PRN
  Filled 2020-11-21: qty 250

## 2020-11-21 MED ORDER — PHENYLEPHRINE 40 MCG/ML (10ML) SYRINGE FOR IV PUSH (FOR BLOOD PRESSURE SUPPORT): 80.0000 ug | PREFILLED_SYRINGE | INTRAVENOUS | Status: DC | PRN

## 2020-11-21 MED ORDER — TERBUTALINE SULFATE 1 MG/ML IJ SOLN: 0.2500 mg | Freq: Once | INTRAMUSCULAR | Status: DC | PRN

## 2020-11-21 MED ORDER — ACETAMINOPHEN 325 MG PO TABS: 650.0000 mg | ORAL_TABLET | ORAL | Status: DC | PRN

## 2020-11-21 MED ORDER — OXYTOCIN-SODIUM CHLORIDE 30-0.9 UT/500ML-% IV SOLN
1.0000 m[IU]/min | INTRAVENOUS | Status: DC
  Administered 2020-11-21: 2 m[IU]/min via INTRAVENOUS
  Filled 2020-11-21: qty 500

## 2020-11-21 MED ORDER — WITCH HAZEL-GLYCERIN EX PADS: MEDICATED_PAD | CUTANEOUS | Status: DC | PRN

## 2020-11-21 MED ORDER — OXYTOCIN BOLUS FROM INFUSION
333.0000 mL | Freq: Once | INTRAVENOUS | Status: AC
  Administered 2020-11-21: 333 mL via INTRAVENOUS

## 2020-11-21 MED ORDER — SOD CITRATE-CITRIC ACID 500-334 MG/5ML PO SOLN: 30.0000 mL | ORAL | Status: DC | PRN

## 2020-11-21 MED ORDER — LACTATED RINGERS IV SOLN: INTRAVENOUS | Status: DC

## 2020-11-21 MED ORDER — LIDOCAINE HCL (PF) 1 % IJ SOLN
INTRAMUSCULAR | Status: DC | PRN
  Administered 2020-11-21: 10 mL via EPIDURAL
  Administered 2020-11-21: 2 mL via EPIDURAL

## 2020-11-21 MED ORDER — LACTATED RINGERS IV SOLN: 500.0000 mL | INTRAVENOUS | Status: DC | PRN

## 2020-11-21 MED ORDER — FENTANYL CITRATE (PF) 100 MCG/2ML IJ SOLN: 100.0000 ug | INTRAMUSCULAR | Status: DC | PRN

## 2020-11-21 MED ORDER — OXYCODONE-ACETAMINOPHEN 5-325 MG PO TABS: 2.0000 | ORAL_TABLET | ORAL | Status: DC | PRN

## 2020-11-21 MED ORDER — OXYTOCIN 10 UNIT/ML IJ SOLN
10.0000 [IU] | Freq: Once | INTRAMUSCULAR | Status: DC | PRN
Start: 2020-11-21 — End: 2020-11-22

## 2020-11-21 MED ORDER — OXYCODONE-ACETAMINOPHEN 5-325 MG PO TABS: 1.0000 | ORAL_TABLET | ORAL | Status: DC | PRN

## 2020-11-21 MED ORDER — FENTANYL-BUPIVACAINE-NACL 0.5-0.125-0.9 MG/250ML-% EP SOLN
EPIDURAL | Status: DC | PRN
  Administered 2020-11-21: 12 mL/h via EPIDURAL

## 2020-11-21 NOTE — Progress Notes (Signed)
Labor Progress Note Colleen Phelps is a 25 y.o. G2P1001 at [redacted]w[redacted]d presented for eIOL/early labor.   S: Doing well. Just had SROM with light mec. Feeling pressure, but has not increased recently.   O:  BP 103/64   Pulse 77   Temp 97.7 F (36.5 C) (Oral)   Resp 15   Ht 5\' 3"  (1.6 m)   Wt 80.5 kg   LMP 12/23/2019   SpO2 100%   BMI 31.44 kg/m  EFM: 130/mod/15x15, no decels   CVE: Dilation: 6 Effacement (%): 100 Station: -2 Presentation: Vertex Exam by:: dr [email or web address]   A&P: 25 y.o. G2P1001 [redacted]w[redacted]d #Labor: Progressing well with recent SROM (light mec fluid). Cont to titrate pit as needed, however having appropriate contraction pattern every 2-3 minutes.  #Pain: Epidural  #FWB: Cat 1  #GBS negative  #TOLAC  Suspected LGA: 3519g at 37 w, extrapolated to 4250g now. No GDM. Previous C/S for failure to descend with infant 4120g. Will monitor closely and prepare for shoulder dystocia if pushing.   [redacted]w[redacted]d, DO 9:21 PM

## 2020-11-21 NOTE — Anesthesia Preprocedure Evaluation (Signed)
Anesthesia Evaluation  Patient identified by MRN, date of birth, ID band Patient awake    Reviewed: Allergy & Precautions, Patient's Chart, lab work & pertinent test results  Airway Mallampati: II  TM Distance: >3 FB Neck ROM: Full    Dental no notable dental hx.    Pulmonary neg pulmonary ROS,    Pulmonary exam normal breath sounds clear to auscultation       Cardiovascular negative cardio ROS Normal cardiovascular exam Rhythm:Regular Rate:Normal     Neuro/Psych negative neurological ROS  negative psych ROS   GI/Hepatic negative GI ROS, Neg liver ROS,   Endo/Other  Obesity BMI 31  Renal/GU negative Renal ROS  negative genitourinary   Musculoskeletal negative musculoskeletal ROS (+)   Abdominal   Peds negative pediatric ROS (+)  Hematology negative hematology ROS (+) hct 36.8, plt 188   Anesthesia Other Findings   Reproductive/Obstetrics (+) Pregnancy TOLAC                             Anesthesia Physical Anesthesia Plan  ASA: 2  Anesthesia Plan: Epidural   Post-op Pain Management:    Induction:   PONV Risk Score and Plan: 2  Airway Management Planned: Natural Airway  Additional Equipment: None  Intra-op Plan:   Post-operative Plan:   Informed Consent: I have reviewed the patients History and Physical, chart, labs and discussed the procedure including the risks, benefits and alternatives for the proposed anesthesia with the patient or authorized representative who has indicated his/her understanding and acceptance.       Plan Discussed with:   Anesthesia Plan Comments:         Anesthesia Quick Evaluation

## 2020-11-21 NOTE — Anesthesia Procedure Notes (Signed)
Epidural Patient location during procedure: OB Start time: 11/21/2020 5:08 PM End time: 11/21/2020 5:15 PM  Staffing Anesthesiologist: Lannie Fields, DO Performed: anesthesiologist   Preanesthetic Checklist Completed: patient identified, IV checked, risks and benefits discussed, monitors and equipment checked, pre-op evaluation and timeout performed  Epidural Patient position: sitting Prep: DuraPrep and site prepped and draped Patient monitoring: continuous pulse ox, blood pressure, heart rate and cardiac monitor Approach: midline Location: L3-L4 Injection technique: LOR air  Needle:  Needle type: Tuohy  Needle gauge: 17 G Needle length: 9 cm Needle insertion depth: 6 cm Catheter type: closed end flexible Catheter size: 19 Gauge Catheter at skin depth: 11 cm Test dose: negative  Assessment Sensory level: T8 Events: blood not aspirated, injection not painful, no injection resistance, no paresthesia and negative IV test  Additional Notes Patient identified. Risks/Benefits/Options discussed with patient including but not limited to bleeding, infection, nerve damage, paralysis, failed block, incomplete pain control, headache, blood pressure changes, nausea, vomiting, reactions to medication both or allergic, itching and postpartum back pain. Confirmed with bedside nurse the patient's most recent platelet count. Confirmed with patient that they are not currently taking any anticoagulation, have any bleeding history or any family history of bleeding disorders. Patient expressed understanding and wished to proceed. All questions were answered. Sterile technique was used throughout the entire procedure. Please see nursing notes for vital signs. Test dose was given through epidural catheter and negative prior to continuing to dose epidural or start infusion. Warning signs of high block given to the patient including shortness of breath, tingling/numbness in hands, complete motor block,  or any concerning symptoms with instructions to call for help. Patient was given instructions on fall risk and not to get out of bed. All questions and concerns addressed with instructions to call with any issues or inadequate analgesia.  Reason for block:procedure for pain

## 2020-11-21 NOTE — MAU Note (Signed)
Presents with c/o that began @ 0400 this morning, states ctxs are now 5 minutes apart.  Denies LOF and Vb.  Endorses +FM.

## 2020-11-21 NOTE — H&P (Addendum)
OBSTETRIC ADMISSION HISTORY AND PHYSICAL  Colleen Phelps is a 25 y.o. female G2P1001 with IUP at [redacted]w[redacted]d by early [place] presenting for elective IOL in early labor. She endorses contractions naturally beginning around 4 AM this morning. She reports +FMs, No LOF, no VB, no blurry vision, headaches or peripheral edema, and RUQ pain.  She plans on breast feeding. She request natural family planning for birth control. She received her prenatal care at  [facility]    Dating: By early [place] --->  Estimated Date of Delivery: 11/20/20  Sono:   @[redacted]w[redacted]d , normal anatomy, cephalic presentation, 3519g, EFW   Prenatal History/Complications:  - TOLAC with hx of cesarean delivery secondary to arrest of descent  - Late to prenatal care, initial prenatal at [redacted]w[redacted]d  - Hx of macrosomnia in infant  - Cystic fibrosis carrier   Past Medical History: Past Medical History:  Diagnosis Date   Medical history non-contributory     Past Surgical History: Past Surgical History:  Procedure Laterality Date   CESAREAN SECTION N/A 10/07/2018   Procedure: CESAREAN SECTION;  Surgeon: 10/09/2018, DO;  Location: [facility] LD ORS;  Service: Obstetrics;  Laterality: N/A;    Obstetrical History: OB History     Gravida  2   Para  1   Term  1   Preterm      AB      Living  1      SAB      IAB      Ectopic      Multiple  0   Live Births  1           Social History Social History   Socioeconomic History   Marital status: Married    Spouse name: Philip Aspen Lepkowski   Number of children: 1   Years of education: currently in college   Highest education level: High school graduate  Occupational History   Not on file  Tobacco Use   Smoking status: Never   Smokeless tobacco: Never  Vaping Use   Vaping Use: Never used  Substance and Sexual Activity   Alcohol use: Yes   Drug use: Never   Sexual activity: Yes    Birth control/protection: None  Other Topics Concern   Not on file  Social History  Narrative   Not on file   Social Determinants of Health   Financial Resource Strain: Low Risk    Difficulty of Paying Living Expenses: Not hard at all  Food Insecurity: No Food Insecurity   Worried About [place] in the Last Year: Never true   Ran Out of Food in the Last Year: Never true  Transportation Needs: No Transportation Needs   Lack of Transportation (Medical): No   Lack of Transportation (Non-Medical): No  Physical Activity: Not on file  Stress: No Stress Concern Present   Feeling of Stress : Not at all  Social Connections: Not on file    Family History: Family History  Problem Relation Age of Onset   Obesity Maternal Aunt    Obesity Maternal Grandmother    Diabetes Maternal Grandfather     Allergies: No Known Allergies  No medications prior to admission.     Review of Systems   All systems reviewed and negative except as stated in HPI  Blood pressure 124/77, pulse 82, temperature 98.9 F (37.2 C), temperature source Oral, resp. rate 18, height 5\' 3"  (1.6 m), weight 80.5 kg, last menstrual period 12/23/2019, SpO2 99 %, unknown if  currently breastfeeding. General appearance: alert, moderate distress, and pacing around the room due to pain Lungs: clear to auscultation bilaterally Heart: regular rate and rhythm Abdomen: soft, non-tender; bowel sounds normal Extremities: Homans sign is negative, no sign of DVT Presentation: cephalic Fetal monitoring: Baseline: 140 bpm, Variability: Good {> 6 bpm), Accelerations: Reactive, and Decelerations: Absent Uterine activity: Frequency: Every 3-5 minutes Dilation: 3 Effacement (%): 60 Station: -3 Exam by:: Zenia Resides, RN   Prenatal labs: ABO, Rh: --/--/O POS (08/09 1445) Antibody: NEG (08/09 1445) Rubella: 1.85 (02/09 1439) RPR: Non Reactive (05/19 0827)  HBsAg: Negative (02/09 1439)  HIV: Non Reactive (05/19 0827)  GBS: Negative/-- (07/14 1025)  2 hr GTT: Normal  Genetic screening: Negative AFP,  carrier for cystic fibrosis  Anatomy US: Normal   Prenatal Transfer Tool  Maternal Diabetes: No Genetic Screening: AFP negative, NIPS low risk, carrier for cystic fibrosis  Maternal Ultrasounds/Referrals: Normal Fetal Ultrasounds or other Referrals:  None Maternal Substance Abuse:  No Significant Maternal Medications:  None Significant Maternal Lab Results: Group B Strep negative  Results for orders placed or performed during the hospital encounter of 11/21/20 (from the past 24 hour(s))  Type and screen   Collection Time: 11/21/20  2:45 PM  Result Value Ref Range   ABO/RH(D) O POS    Antibody Screen NEG    Sample Expiration      11/24/2020,2359 Performed at [facility], [address] [place], [place] [address]   CBC   Collection Time: 11/21/20  2:46 PM  Result Value Ref Range   WBC 9.8 4.0 - 10.5 K/uL   RBC 4.05 3.87 - 5.11 MIL/uL   Hemoglobin 12.0 12.0 - 15.0 g/dL   HCT 94.4 96.7 - 59.1 %   MCV 90.9 80.0 - 100.0 fL   MCH 29.6 26.0 - 34.0 pg   MCHC 32.6 30.0 - 36.0 g/dL   RDW 63.8 46.6 - 59.9 %   Platelets 188 150 - 400 K/uL   nRBC 0.0 0.0 - 0.2 %    Patient Active Problem List   Diagnosis Date Noted   Indication for care in labor or delivery 11/21/2020   Cystic fibrosis carrier in second trimester, antepartum 06/12/2020   Supervision of other normal pregnancy, antepartum 05/24/2020   History of cesarean delivery, currently pregnant 05/24/2020   Hx of macrosomia in infant in prior pregnancy, currently pregnant, second trimester 05/24/2020   Late prenatal care affecting pregnancy in second trimester 05/24/2020    Assessment/Plan:  Colleen Phelps is a 25 y.o. G2P1001 at [redacted]w[redacted]d here for scheduled eIOL, latent labor.  #Labor: The patient reports having contractions beginning around 4 AM this morning. She was scheduled for an elective IOL today. Membranes intact, patient denies LOF. Will plan to augment labor with Pitocin. #Pain: Epidural  #FWB: Category I   #ID:  GBS negative  #MOF: Breast #MOC: NFP   Athena Masse, Student-PA  11/21/2020, 4:14 PM   GME ATTESTATION:  I saw and evaluated the patient. I agree with the findings and the plan of care as documented in the student's note. I have made changes to documentation as necessary.   SVE upon arrival to the floor 6/100/-2. Bulging bag. Cephalic presentation confirmed by BSUS. Pitocin ordered due to contractions spacing out. Will recheck in 2 hours and consider AROM at next check if appropriate.  Evalina Field, MD OB Fellow, Faculty [facility], Center for [facility] 11/21/2020 8:33 PM

## 2020-11-21 NOTE — Discharge Summary (Addendum)
Postpartum Discharge Summary  Date of Service updated: 11/23/20     Patient Name: Colleen Phelps DOB: July 05, 1995 MRN: [id number]  Date of admission: 11/21/2020 Delivery date:11/21/2020  Delivering provider: Patriciaann Clan  Date of discharge: 11/23/2020  Admitting diagnosis: Indication for care in labor or delivery [O75.9] Pregnancy [Z34.90] Intrauterine pregnancy: [redacted]w[redacted]d    Secondary diagnosis:  Active Problems:   Indication for care in labor or delivery   Pregnancy  Additional problems: N/A    Discharge diagnosis: Term Pregnancy Delivered and VBAC                                              Post partum procedures: None  Augmentation: Pitocin Complications: None  Hospital course: Induction of Labor With Vaginal Delivery   25y.o. yo G2P1001 at 449w1das admitted to the hospital 11/21/2020 for induction of labor.  Indication for induction: Elective.  Patient had an uncomplicated labor course as follows: Membrane Rupture Time/Date: 8:50 PM ,11/21/2020   Delivery Method:VBAC, Spontaneous  Episiotomy: None  Lacerations:  None  Details of delivery can be found in separate delivery note.  Patient had a routine postpartum course. Patient is discharged home 11/23/20.  Newborn Data: Birth date:11/21/2020  Birth time:11:06 PM  Gender:Female  Living status:Living  Apgars:9 ,9  Weight:3436 g   Magnesium Sulfate received: No BMZ received: No Rhophylac:No MMR:No T-DaP:Given prenatally Flu: N/A Transfusion:No  Physical exam  Vitals:   11/22/20 0930 11/22/20 1345 11/22/20 1914 11/22/20 2023  BP: 117/72 (!) 103/56 111/79 118/79  Pulse: 68 68 71 68  Resp: 17 17 18 18   Temp: 98 F (36.7 C) 98 F (36.7 C) 98.1 F (36.7 C) (!) 97.5 F (36.4 C)  TempSrc:  Oral Oral Oral  SpO2:   99% 100%  Weight:      Height:       General: alert, cooperative, and no distress Lochia: appropriate Uterine Fundus: firm Incision: N/A DVT Evaluation: No evidence of DVT seen on physical  exam. Labs: Lab Results  Component Value Date   WBC 9.8 11/21/2020   HGB 12.0 11/21/2020   HCT 36.8 11/21/2020   MCV 90.9 11/21/2020   PLT 188 11/21/2020   CMP Latest Ref Rng & Units 12/19/2017  Glucose 70 - 99 mg/dL 119(H)  BUN 6 - 20 mg/dL 11  Creatinine 0.44 - 1.00 mg/dL 0.74  Sodium 135 - 145 mmol/L 140  Potassium 3.5 - 5.1 mmol/L 3.0(L)  Chloride 98 - 111 mmol/L 106  CO2 22 - 32 mmol/L 22  Calcium 8.9 - 10.3 mg/dL 9.3  Total Protein 6.5 - 8.1 g/dL 7.3  Total Bilirubin 0.3 - 1.2 mg/dL 0.5  Alkaline Phos 38 - 126 U/L 69  AST 15 - 41 U/L 22  ALT 0 - 44 U/L 13   Edinburgh Score: Edinburgh Postnatal Depression Scale Screening Tool 11/22/2020  I have been able to laugh and see the funny side of things. 0  I have looked forward with enjoyment to things. 0  I have blamed myself unnecessarily when things went wrong. 0  I have been anxious or worried for no good reason. 0  I have felt scared or panicky for no good reason. 1  Things have been getting on top of me. 0  I have been so unhappy that I have had difficulty sleeping. 0  I have  felt sad or miserable. 0  I have been so unhappy that I have been crying. 0  The thought of harming myself has occurred to me. 0  Edinburgh Postnatal Depression Scale Total 1     After visit meds:  Allergies as of 11/23/2020   No Known Allergies      Medication List     TAKE these medications    acetaminophen 325 MG tablet Commonly known as: Tylenol Take 2 tablets (650 mg total) by mouth every 6 (six) hours as needed.   benzocaine-Menthol 20-0.5 % Aero Commonly known as: DERMOPLAST Apply 1 application topically as needed for irritation (perineal discomfort).   coconut oil Oil Apply 1 application topically as needed.   ibuprofen 600 MG tablet Commonly known as: ADVIL Take 1 tablet (600 mg total) by mouth every 6 (six) hours.   prenatal multivitamin Tabs tablet Take 1 tablet by mouth daily at 12 noon.         Discharge  home in stable condition Infant Feeding: Breast Infant Disposition:home with mother Discharge instruction: per After Visit Summary and Postpartum booklet. Activity: Advance as tolerated. Pelvic rest for 6 weeks.  Diet: routine diet Future Appointments: Future Appointments  Date Time Provider [place]  12/20/2020  9:55 AM Gabriel Carina, CNM [facility] None   Follow up Visit:  Message sent by Dr. Higinio Plan on 11/21/2020  Please schedule this patient for a In person postpartum visit in 4 weeks with the following provider: Any provider. Additional Postpartum F/U: None    Low risk pregnancy complicated by: TOLAC/history of macrosomia Delivery mode:  VBAC, Spontaneous  Anticipated Birth Control:   NFP   11/23/2020 [name], MD  GME ATTESTATION:  I saw and evaluated the patient. I agree with the findings and the plan of care as documented in the resident's note.  Darrelyn Hillock, DO OB Fellow, [place] for [place] 11/23/2020 11:28 PM

## 2020-11-21 NOTE — Plan of Care (Signed)
  Problem: Education: Goal: Knowledge of Childbirth will improve Outcome: Completed/Met Goal: Ability to make informed decisions regarding treatment and plan of care will improve Outcome: Completed/Met Goal: Ability to state and carry out methods to decrease the pain will improve Outcome: Completed/Met Goal: Individualized Educational Video(s) Outcome: Completed/Met

## 2020-11-22 ENCOUNTER — Encounter (HOSPITAL_COMMUNITY): Payer: Self-pay | Admitting: Family Medicine

## 2020-11-22 ENCOUNTER — Encounter: Payer: BC Managed Care – PPO | Admitting: Certified Nurse Midwife

## 2020-11-22 LAB — RPR: RPR Ser Ql: NONREACTIVE

## 2020-11-22 MED ORDER — DIBUCAINE (PERIANAL) 1 % EX OINT: 1.0000 "application " | TOPICAL_OINTMENT | CUTANEOUS | Status: DC | PRN

## 2020-11-22 MED ORDER — WITCH HAZEL-GLYCERIN EX PADS: 1.0000 "application " | MEDICATED_PAD | CUTANEOUS | Status: DC | PRN

## 2020-11-22 MED ORDER — SIMETHICONE 80 MG PO CHEW: 80.0000 mg | CHEWABLE_TABLET | ORAL | Status: DC | PRN

## 2020-11-22 MED ORDER — TETANUS-DIPHTH-ACELL PERTUSSIS 5-2.5-18.5 LF-MCG/0.5 IM SUSY: 0.5000 mL | PREFILLED_SYRINGE | Freq: Once | INTRAMUSCULAR | Status: DC

## 2020-11-22 MED ORDER — ZOLPIDEM TARTRATE 5 MG PO TABS: 5.0000 mg | ORAL_TABLET | Freq: Every evening | ORAL | Status: DC | PRN

## 2020-11-22 MED ORDER — BENZOCAINE-MENTHOL 20-0.5 % EX AERO: 1.0000 "application " | INHALATION_SPRAY | CUTANEOUS | Status: DC | PRN

## 2020-11-22 MED ORDER — DIPHENHYDRAMINE HCL 25 MG PO CAPS: 25.0000 mg | ORAL_CAPSULE | Freq: Four times a day (QID) | ORAL | Status: DC | PRN

## 2020-11-22 MED ORDER — ACETAMINOPHEN 325 MG PO TABS
650.0000 mg | ORAL_TABLET | ORAL | Status: DC | PRN
  Administered 2020-11-22: 650 mg via ORAL
  Filled 2020-11-22: qty 2

## 2020-11-22 MED ORDER — ACETAMINOPHEN 500 MG PO TABS
1000.0000 mg | ORAL_TABLET | Freq: Four times a day (QID) | ORAL | Status: DC | PRN
  Administered 2020-11-22 (×2): 1000 mg via ORAL
  Filled 2020-11-22 (×2): qty 2

## 2020-11-22 MED ORDER — SODIUM CHLORIDE 0.9% FLUSH
3.0000 mL | Freq: Two times a day (BID) | INTRAVENOUS | Status: DC
  Administered 2020-11-22: 3 mL via INTRAVENOUS

## 2020-11-22 MED ORDER — PRENATAL MULTIVITAMIN CH
1.0000 | ORAL_TABLET | Freq: Every day | ORAL | Status: DC
  Administered 2020-11-22: 1 via ORAL
  Filled 2020-11-22: qty 1

## 2020-11-22 MED ORDER — ONDANSETRON HCL 4 MG PO TABS: 4.0000 mg | ORAL_TABLET | ORAL | Status: DC | PRN

## 2020-11-22 MED ORDER — IBUPROFEN 600 MG PO TABS
600.0000 mg | ORAL_TABLET | Freq: Four times a day (QID) | ORAL | Status: DC
  Administered 2020-11-22 – 2020-11-23 (×5): 600 mg via ORAL
  Filled 2020-11-22 (×5): qty 1

## 2020-11-22 MED ORDER — SODIUM CHLORIDE 0.9 % IV SOLN: 250.0000 mL | INTRAVENOUS | Status: DC | PRN

## 2020-11-22 MED ORDER — MEASLES, MUMPS & RUBELLA VAC IJ SOLR: 0.5000 mL | Freq: Once | INTRAMUSCULAR | Status: DC

## 2020-11-22 MED ORDER — COCONUT OIL OIL: 1.0000 "application " | TOPICAL_OIL | Status: DC | PRN

## 2020-11-22 MED ORDER — SENNOSIDES-DOCUSATE SODIUM 8.6-50 MG PO TABS
2.0000 | ORAL_TABLET | ORAL | Status: DC
  Administered 2020-11-22 – 2020-11-23 (×2): 2 via ORAL
  Filled 2020-11-22 (×2): qty 2

## 2020-11-22 MED ORDER — SODIUM CHLORIDE 0.9% FLUSH: 3.0000 mL | INTRAVENOUS | Status: DC | PRN

## 2020-11-22 MED ORDER — ONDANSETRON HCL 4 MG/2ML IJ SOLN: 4.0000 mg | INTRAMUSCULAR | Status: DC | PRN

## 2020-11-22 NOTE — Progress Notes (Addendum)
Post Partum Day 1 Subjective: up ad lib, voiding, tolerating PO, and + flatus. Lochia normal. Patient is doing well this morning and has mild pelvic and back pain. Says pain is well controlled and has no other complaints. Declines contraception before discharge.  Objective: Blood pressure 117/72, pulse 68, temperature 98 F (36.7 C), resp. rate 17, height 5\' 3"  (1.6 m), weight 80.5 kg, last menstrual period 12/23/2019, SpO2 99 %, unknown if currently breastfeeding.  Physical Exam:  General: alert and no distress Lochia: appropriate Uterine Fundus: firm Incision: no incision DVT Evaluation: No evidence of DVT seen on physical exam. No cords or calf tenderness. No significant calf/ankle edema.  Recent Labs    11/21/20 1446  HGB 12.0  HCT 36.8    Assessment/Plan: -Patient meeting all postpartum milestones. -Patient had a few elevated BP, measurements were peripartum and have since been normal, not a concern at this moment. -Plan for discharge tomorrow and Contraception declined, patient would like to try Family planing.   LOS: 1 day   Colleen Phelps 11/22/2020, 10:26 AM   Midwife attestation I have seen and examined this patient and agree with above documentation in the student's note.   Post Partum Day 1  Colleen Phelps is a 25 y.o. 25 s/p SVD.  Pt denies problems with ambulating, voiding or po intake. Pain is well controlled. Method of Feeding: breast  PE:  Gen: well appearing Heart: reg rate Lungs: normal WOB Fundus firm Ext: soft, no pain, no edema  Assessment: S/p SVD PPD #1 Transient HTN  Plan for discharge: tomorrow Monitor BP  [id number], CNM 11:13 AM

## 2020-11-22 NOTE — Lactation Note (Signed)
This note was copied from a baby's chart. Lactation Consultation Note  Patient Name: Colleen Phelps [id number] Date: 11/22/2020   Age:24 hours P2, term female infant. Per mom, infant is latching well and more alert with feedings. LC did not observe latch , infant recently finished breastfeeding prior to [facility] entering the room.  Infant is currently breastfeeding 15 to 20  minutes on each breast, R -breast ( 1545- 1605 pm) and L-breast ( 1615 - 1630 pm).  Mom will continue to breastfeed infant by cues, 8 to 12 or more times within 24 hours, skin to skin. Mom doesn't have any questions or concern for LC at this time.  Maternal Data    Feeding    LATCH Score                    Lactation Tools Discussed/Used    Interventions    Discharge    Consult Status      Danelle Earthly 11/22/2020, 4:41 PM

## 2020-11-22 NOTE — Lactation Note (Signed)
This note was copied from a baby's chart. Lactation Consultation Note  Patient Name: Colleen Phelps [id number] Date: 11/22/2020 Reason for consult: L&D Initial assessment;Term Age:24 hours LC entered the room infant was cuing to breastfeed as mom was doing STS with infant.  Mom latched infant on her right breast, infant latched with depth and breastfeed for 13 minutes and then switched to mom's left breast using the  football hold position and infant was still breastfeeding after 18 minutes when LC left the room. LC discussed infant's input and output with parents. Mom's plan: 1- Mom will breastfeed infant according to primal cues: licking, kissing, smacking, tasting, hands and fist in mouth, 8 to 12 times within 24 hours, Skin to skin. 2- Mom knows to call RN or LC on MBU if she has breastfeeding questions, concerns or need further assistance with latching infant at the breast.  Maternal Data Has patient been taught Hand Expression?: Yes Does the patient have breastfeeding experience prior to this delivery?: Yes How long did the patient breastfeed?: Per mom, inital she did not have the breastfeeding experience she has had today 1st child was in NICU for 2 weeks and latching was challenging at first, she breastfeed her 1 year old daughter for 7 months.  Feeding Mother's Current Feeding Choice: Breast Milk  LATCH Score Latch: Grasps breast easily, tongue down, lips flanged, rhythmical sucking.  Audible Swallowing: A few with stimulation  Type of Nipple: Everted at rest and after stimulation  Comfort (Breast/Nipple): Soft / non-tender  Hold (Positioning): Assistance needed to correctly position infant at breast and maintain latch.  LATCH Score: 8   Lactation Tools Discussed/Used    Interventions Interventions: Breast feeding basics reviewed;Assisted with latch;Skin to skin;Hand express;Breast compression;Adjust position;Support pillows;Position options;Expressed  milk;Education  Discharge    Consult Status Consult Status: Follow-up Date: 11/22/20 Follow-up type: In-patient    Danelle Earthly 11/22/2020, 12:04 AM

## 2020-11-22 NOTE — Anesthesia Postprocedure Evaluation (Signed)
Anesthesia Post Note  Patient: Colleen Phelps  Procedure(s) Performed: AN AD HOC LABOR EPIDURAL     Patient location during evaluation: Mother Baby Anesthesia Type: Epidural Level of consciousness: awake Pain management: satisfactory to patient Vital Signs Assessment: post-procedure vital signs reviewed and stable Respiratory status: spontaneous breathing Cardiovascular status: stable Anesthetic complications: no   No notable events documented.  Last Vitals:  Vitals:   11/22/20 0155 11/22/20 0545  BP: 90/75 104/60  Pulse: 97 77  Resp: 18 16  Temp: 36.7 C 36.7 C  SpO2: 100% 99%    Last Pain:  Vitals:   11/22/20 0545  TempSrc: Oral  PainSc: 5    Pain Goal:                   Cephus Shelling

## 2020-11-22 NOTE — Lactation Note (Addendum)
This note was copied from a baby's chart. Lactation Consultation Note  Patient Name: Girl Talani Brazee [id number] Date: 11/22/2020 Reason for consult: Follow-up assessment;Term Age:24 hours   P2 mother whose infant is now 1 hours old.  This is a term baby at 40+1 weeks.  Mother breast fed her first child (now 18 years old) for 7 months.  Mother reported trying to breast feed her recently but having a difficult time due to baby being sleepy.  She has breast fed well twice since delivery.  Praised mother for her efforts.  Reviewed breast feeding basics and reassured her that sleepiness is typical the first 24 hours.  Offered to assist.  Asked permission to remove clothing and assisted baby to latch easily in the cross cradle hold to the right breast.  With gentle stimulation baby began sucking rhythmically.  Demonstrated breast compressions and observed her feeding for 10 minutes prior to exiting the room.  Encouraged to call for assistance as needed and practice hand expression before/after feedings to help with milk supply.  Mother appreciative of help provided.  Mom made aware of O/P services, breastfeeding support groups, community resources, and our phone # for post-discharge questions.  Mother has a DEBP for home use.  Father asleep on the couch.  Manual pump provided per mother's request.  She felt comfortable with pump use and did not wish to have directions.  Coconut oil given for nipple comfort.   Maternal Data Has patient been taught Hand Expression?: Yes Does the patient have breastfeeding experience prior to this delivery?: Yes How long did the patient breastfeed?: 7 months  Feeding Mother's Current Feeding Choice: Breast Milk  LATCH Score Latch: Repeated attempts needed to sustain latch, nipple held in mouth throughout feeding, stimulation needed to elicit sucking reflex.  Audible Swallowing: None  Type of Nipple: Everted at rest and after stimulation  Comfort  (Breast/Nipple): Soft / non-tender  Hold (Positioning): Assistance needed to correctly position infant at breast and maintain latch.  LATCH Score: 6   Lactation Tools Discussed/Used    Interventions Interventions: Breast feeding basics reviewed;Assisted with latch;Skin to skin;Breast massage;Hand express;Breast compression;Adjust position;Hand pump;Position options;Support pillows;Education  Discharge Pump: Manual (Per mother's request)  Consult Status Consult Status: Follow-up Date: 11/23/20 Follow-up type: In-patient    Dora Sims 11/22/2020, 7:33 AM

## 2020-11-23 MED ORDER — BENZOCAINE-MENTHOL 20-0.5 % EX AERO: 1.0000 "application " | INHALATION_SPRAY | CUTANEOUS | Status: DC | PRN

## 2020-11-23 MED ORDER — PRENATAL MULTIVITAMIN CH: 1.0000 | ORAL_TABLET | Freq: Every day | ORAL | Status: DC

## 2020-11-23 MED ORDER — ACETAMINOPHEN 325 MG PO TABS: 650.0000 mg | ORAL_TABLET | Freq: Four times a day (QID) | ORAL | Status: DC | PRN

## 2020-11-23 MED ORDER — IBUPROFEN 600 MG PO TABS: 600.0000 mg | ORAL_TABLET | Freq: Four times a day (QID) | ORAL | 0 refills | Status: DC

## 2020-11-23 MED ORDER — COCONUT OIL OIL: 1.0000 "application " | TOPICAL_OIL | 0 refills | Status: DC | PRN

## 2020-12-04 ENCOUNTER — Telehealth (HOSPITAL_COMMUNITY): Payer: Self-pay | Admitting: *Deleted

## 2020-12-04 NOTE — Telephone Encounter (Signed)
Left message to return nurse call.  Duffy Rhody, RN 12-04-2020 at 3:47pm

## 2020-12-20 ENCOUNTER — Ambulatory Visit: Payer: BC Managed Care – PPO | Admitting: Certified Nurse Midwife

## 2022-03-31 IMAGING — US US MFM OB FOLLOW-UP
1 series · 14 of 28 positions shown · non-contrast
Comparison: none

[Series 1: us mfm ob follow-up · 28 acquisitions, 14 frames shown]
[im 2/28]
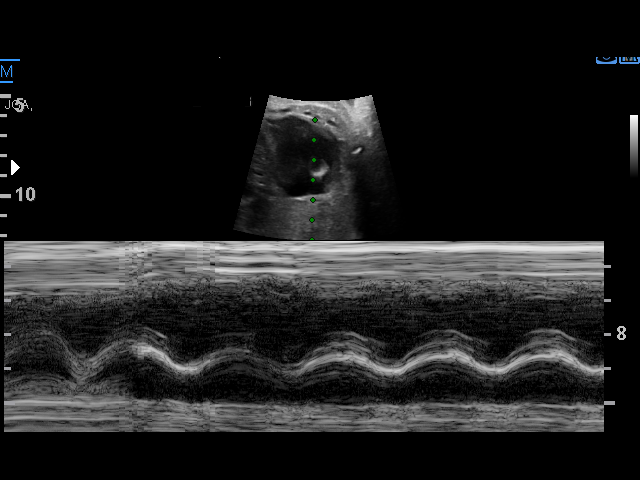
[im 4/28]
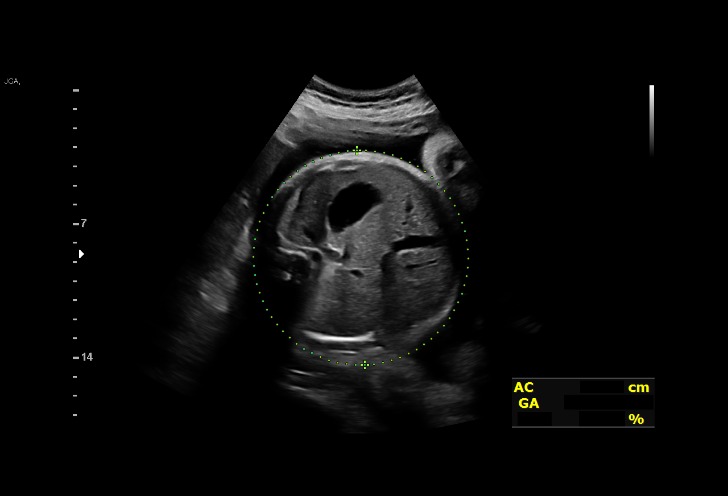
[im 6/28]
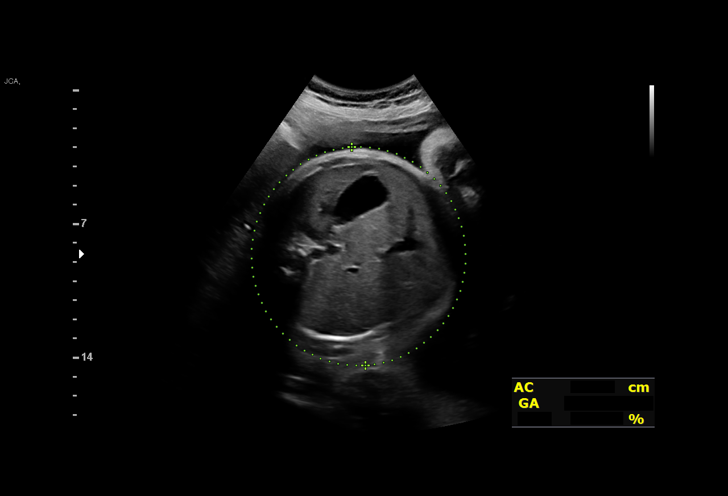
[im 8/28]
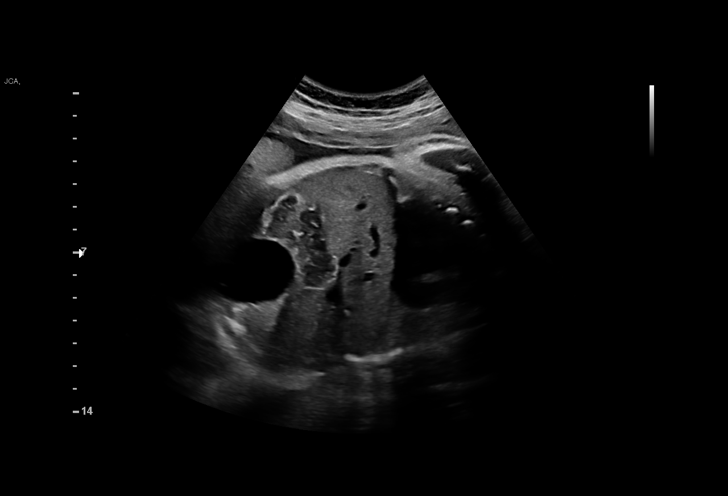
[im 10/28]
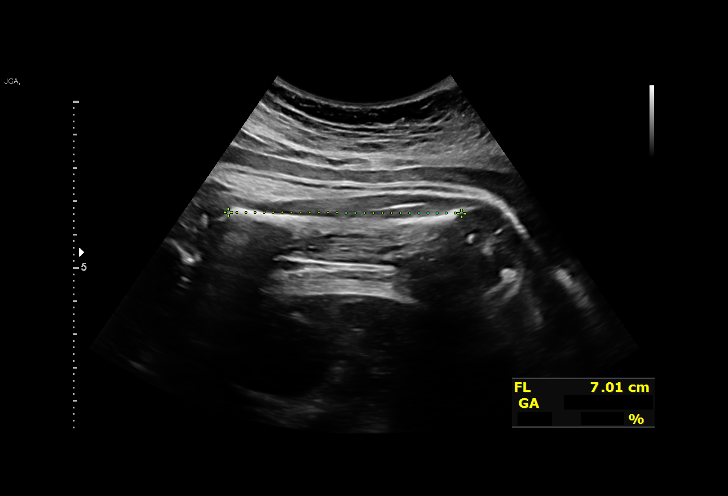
[im 12/28]
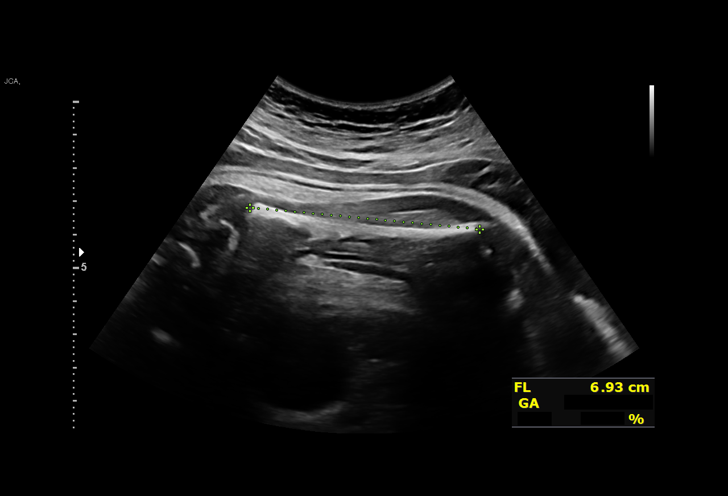
[im 14/28]
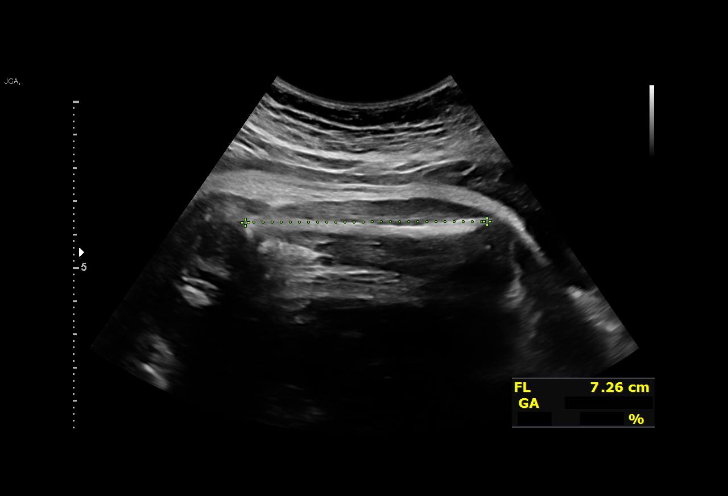
[im 16/28]
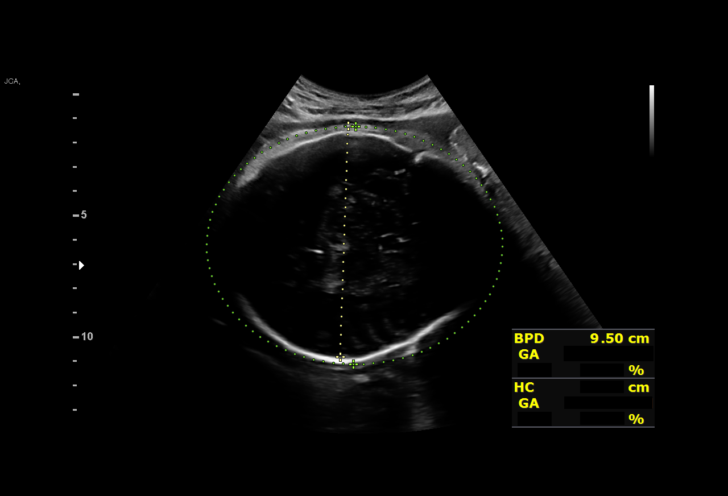
[im 18/28]
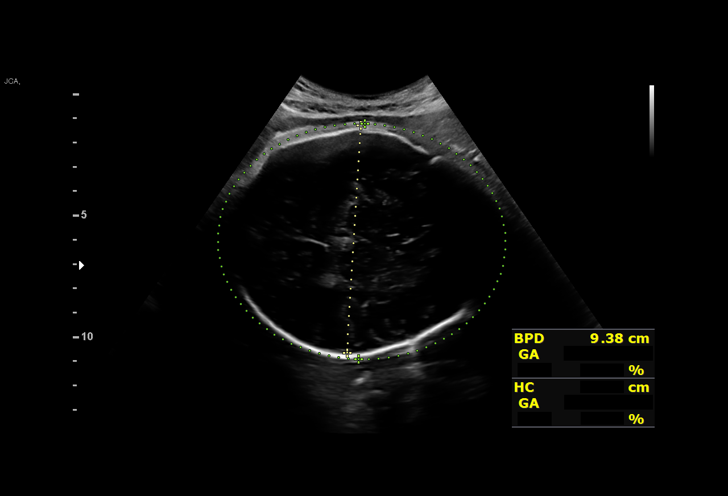
[im 20/28]
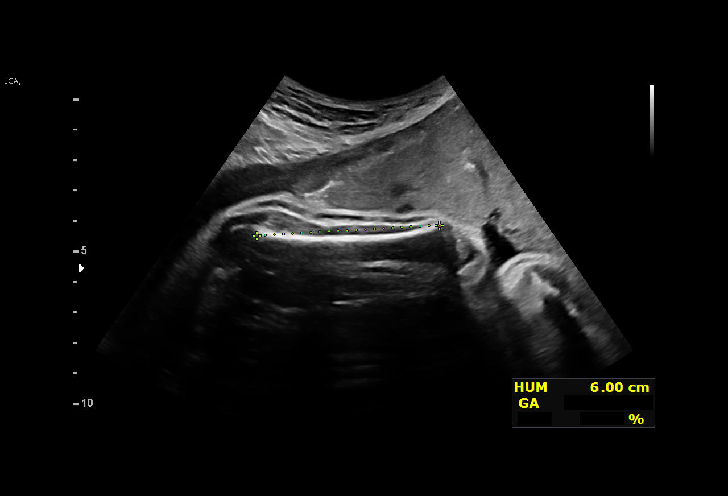
[im 22/28]
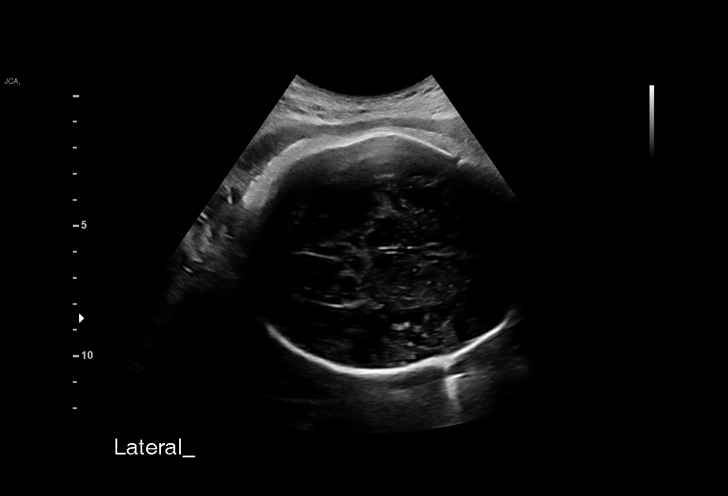
[im 24/28]
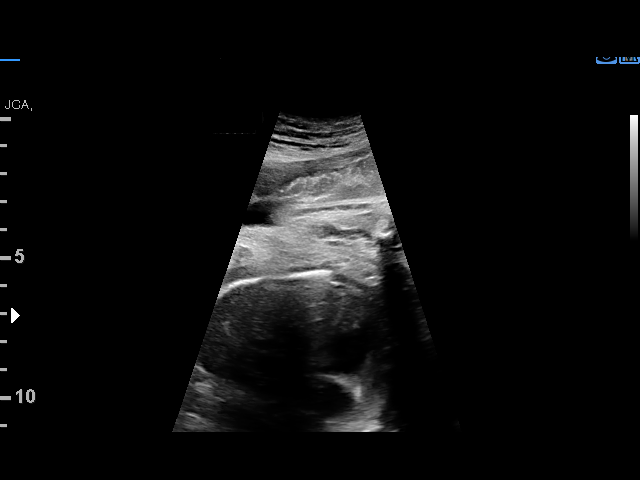
[im 26/28]
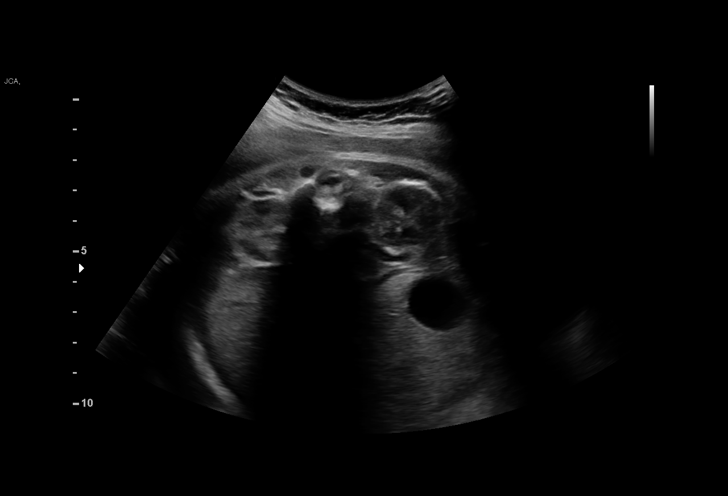
[im 28/28]
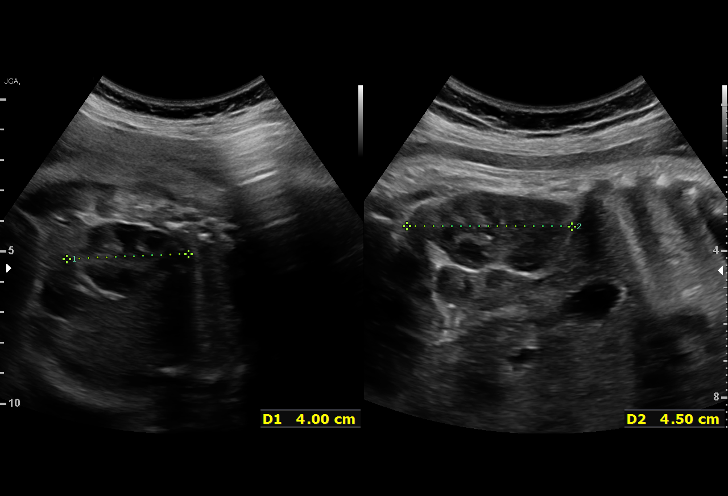

[14 of 28 positions shown; findings below may reference images not displayed]

CNM

Indications

 Poor obstetric history: Prior fetal
 macrosomia, antepartum
 Genetic carrier (Positive for two varients in
 the gene associated with Cystic Fibrosis)
 History of cesarean delivery, currently
 pregnant
 Low Risk NIPS(Negative AFP)
 Encounter for other antenatal screening
 follow-up
 37 weeks gestation of pregnancy
Fetal Evaluation

 Num Of Fetuses:         1
 Fetal Heart Rate(bpm):  138
 Cardiac Activity:       Observed
 Presentation:           Cephalic
 Placenta:               Anterior
 P. Cord Insertion:      Previously Visualized

 Amniotic Fluid
 AFI FV:      Within normal limits
Biometry

 BPD:      94.2  mm     G. Age:  38w 3d         92  %    CI:        75.65   %    70 - 86
                                                         FL/HC:      20.6   %    20.8 -
 HC:      343.4  mm     G. Age:  39w 5d         84  %    HC/AC:      0.97        0.92 -
 AC:      353.5  mm     G. Age:  39w 2d         98  %    FL/BPD:     74.9   %    71 - 87
 FL:       70.6  mm     G. Age:  36w 1d         28  %    FL/AC:      20.0   %    20 - 24
 HUM:      60.3  mm     G. Age:  35w 0d         31  %
 LV:        2.8  mm

 Est. FW:    7206  gm    7 lb 12 oz      90  %
OB History

 Gravidity:    2         Term:   1        Prem:   0        SAB:   0
 TOP:          0       Ectopic:  0        Living: 1
Gestational Age

 LMP:           44w 4d        Date:  12/23/19                 EDD:   09/28/20
 U/S Today:     38w 3d                                        EDD:   11/10/20
 Best:          37w 0d     Det. By:  U/S  (06/05/20)          EDD:   11/20/20
Anatomy

 Cranium:               Appears normal         Aortic Arch:            Previously seen
 Cavum:                 Previously seen        Ductal Arch:            Previously seen
 Ventricles:            Appears normal         Diaphragm:              Appears normal
 Choroid Plexus:        Previously seen        Stomach:                Appears normal, left
                                                                       sided
 Cerebellum:            Previously seen        Abdomen:                Previously seen
 Posterior Fossa:       Previously seen        Abdominal Wall:         Previously seen
 Nuchal Fold:           Previously seen        Cord Vessels:           Previously seen
 Face:                  Orbits and profile     Kidneys:                Appear normal
                        previously seen
 Lips:                  Previously seen        Bladder:                Appears normal
 Thoracic:              Appears normal         Spine:                  Previously seen
                        Appears normal
 Heart:                 Appears normal         Upper Extremities:      Previously seen
                        (4CH, axis, and
                        situs)
 RVOT:                  Previously seen        Lower Extremities:      Previously seen
 LVOT:                  Previously seen

 Other:  SVC, IVC, 3VV/T, Heels, 5th digit, and Nasal bone previously
         visualized. Fetus appears to be female.
Impression

 Fetal growth is appropriate for gestational age .  The
 estimated fetal weight is at the 90th percentile.Amniotic fluid
 is normal and good fetal activity is seen .

 Patient does not have gestational diabetes.  Blood pressure
 today at her office is 103/54 mmHg.

 Obstetric history significant for term cesarean deliveries.
 Patient plans to have VBAC.
Recommendations

 Follow-up scans as clinically indicated.
                 De Borja, Myk

## 2023-06-18 ENCOUNTER — Ambulatory Visit (HOSPITAL_COMMUNITY)
Admission: EM | Admit: 2023-06-18 | Discharge: 2023-06-18 | Disposition: A | Discharge status: Home / Self Care | Attending: Emergency Medicine | Admitting: Emergency Medicine

## 2023-06-18 ENCOUNTER — Encounter (HOSPITAL_COMMUNITY): Payer: Self-pay

## 2023-06-18 DIAGNOSIS — R051 Acute cough: Secondary | ICD-10-CM | POA: Diagnosis not present

## 2023-06-18 DIAGNOSIS — R197 Diarrhea, unspecified: Secondary | ICD-10-CM

## 2023-06-18 DIAGNOSIS — J069 Acute upper respiratory infection, unspecified: Secondary | ICD-10-CM | POA: Diagnosis not present

## 2023-06-18 MED ORDER — AZITHROMYCIN 250 MG PO TABS: ORAL_TABLET | ORAL | 0 refills | Status: AC

## 2023-06-18 MED ORDER — PROMETHAZINE-DM 6.25-15 MG/5ML PO SYRP: 5.0000 mL | ORAL_SOLUTION | Freq: Every evening | ORAL | 0 refills | Status: AC | PRN

## 2023-06-18 MED ORDER — BENZONATATE 100 MG PO CAPS: 100.0000 mg | ORAL_CAPSULE | Freq: Three times a day (TID) | ORAL | 0 refills | Status: AC

## 2023-06-18 NOTE — ED Provider Notes (Signed)
 [facility]    CSN: [id number] Arrival date & time: 06/18/23  4098      History   Chief Complaint No chief complaint on file.   HPI Colleen Phelps is a 28 y.o. female.   Patient presents with headache, productive cough with green sputum, chills, fever, congestion, sore throat, fatigue, and diarrhea x 1 week.  Patient states the first 4 days she was vomiting, but vomiting has since subsided.  Denies severe chest pain, shortness of breath, and abdominal pain.     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, antepartum 06/12/2020   Supervision of other normal pregnancy, antepartum 05/24/2020   History of cesarean delivery, currently pregnant 05/24/2020   Hx of macrosomia in infant in prior pregnancy, currently pregnant, second trimester 05/24/2020   Late prenatal care affecting pregnancy in second trimester 05/24/2020    Past Surgical History:  Procedure Laterality Date   CESAREAN SECTION N/A 10/07/2018   Procedure: CESAREAN SECTION;  Surgeon: Philip Aspen, DO;  Location: [facility];  Service: Obstetrics;  Laterality: N/A;    OB History     Gravida  2   Para  2   Term  2   Preterm      AB      Living  2      SAB      IAB      Ectopic      Multiple  0   Live Births  2            Home Medications    Prior to Admission medications   Medication Sig Start Date End Date Taking? Authorizing Provider  azithromycin (ZITHROMAX Z-PAK) 250 MG tablet Take 2 pills (500mg ) first day and one pill (250mg ) the remaining 4 days. 06/18/23  Yes Susann Givens, [place] Rogacki A, NP  benzonatate (TESSALON) 100 MG capsule Take 1 capsule (100 mg total) by mouth every 8 (eight) hours. 06/18/23  Yes Wynonia Lawman A, NP  promethazine-dextromethorphan  (PROMETHAZINE-DM) 6.25-15 MG/5ML syrup Take 5 mLs by mouth at bedtime as needed for cough. 06/18/23  Yes Letta Kocher, NP    Family History Family History  Problem Relation Age of Onset   Obesity Maternal Aunt    Obesity Maternal Grandmother    Diabetes Maternal Grandfather     Social History Social History   Tobacco Use   Smoking status: Never   Smokeless tobacco: Never  Vaping Use   Vaping status: Never Used  Substance Use Topics   Alcohol use: Yes   Drug use: Never     Allergies   Patient has no known allergies.   Review of Systems Review of Systems  Per HPI  Physical Exam Triage Vital Signs ED Triage Vitals [06/18/23 0847]  Encounter Vitals Group     BP 117/81     Systolic BP Percentile      Diastolic BP Percentile      Pulse Rate (!) 105     Resp 16     Temp 99.9 F (37.7 C)     Temp Source Oral     SpO2 94 %     Weight      Height      Head Circumference      Peak Flow  Pain Score      Pain Loc      Pain Education      Exclude from Growth Chart    No data found.  Updated Vital Signs BP 117/81 (BP Location: Left Arm)   Pulse (!) 105   Temp 99.9 F (37.7 C) (Oral)   Resp 16   LMP 06/10/2023 (Approximate)   SpO2 94%   Visual Acuity Right Eye Distance:   Left Eye Distance:   Bilateral Distance:    Right Eye Near:   Left Eye Near:    Bilateral Near:     Physical Exam Vitals and nursing note reviewed.  Constitutional:      General: She is awake. She is not in acute distress.    Appearance: Normal appearance. She is well-developed and well-groomed. She is not ill-appearing.  HENT:     Right Ear: Tympanic membrane, ear canal and external ear normal.     Left Ear: Tympanic membrane, ear canal and external ear normal.     Nose: Congestion and rhinorrhea present.     Mouth/Throat:     Mouth: Mucous membranes are moist.     Pharynx: Posterior oropharyngeal erythema present. No oropharyngeal exudate.  Cardiovascular:     Rate  and Rhythm: Normal rate and regular rhythm.  Pulmonary:     Effort: Pulmonary effort is normal.     Breath sounds: Normal breath sounds.  Musculoskeletal:        General: Normal range of motion.  Skin:    General: Skin is warm and dry.  Neurological:     Mental Status: She is alert.  Psychiatric:        Behavior: Behavior is cooperative.      UC Treatments / Results  Labs (all labs ordered are listed, but only abnormal results are displayed) Labs Reviewed - No data to display  EKG   Radiology No results found.  Procedures Procedures (including critical care time)  Medications Ordered in UC Medications - No data to display  Initial Impression / Assessment and Plan / UC Course  I have reviewed the triage vital signs and the nursing notes.  Pertinent labs & imaging results that were available during my care of the patient were reviewed by me and considered in my medical decision making (see chart for details).     Patient presented with 1 week history of flulike symptoms.  Patient states that she has started having a productive cough with green sputum.  Upon assessment congestion and rhinorrhea present, mild erythema noted to pharynx.  Lungs clear bilaterally to auscultation.  Prescribed azithromycin for persistent upper respiratory infection.  Prescribed Tessalon and promethazine DM as needed for cough.  Discussed over-the-counter medication for other symptoms.  Discussed return precautions. Final Clinical Impressions(s) / UC Diagnoses   Final diagnoses:  Acute upper respiratory infection  Acute cough  Diarrhea, unspecified type     Discharge Instructions      Start taking azithromycin by taking 2 tablets today and 1 tablet on the remaining 4 days.  I have prescribed Tessalon that you can take every 8 hours as needed for cough.  I have also prescribed promethazine DM cough syrup that you can take at night for cough.  This can make you drowsy so do not drive,  drink, or work while taking.    Otherwise alternate between Tylenol and ibuprofen as needed for pain and fever.  You can also try taking Imodium as needed for diarrhea.  I also recommend  taking Mucinex to help with cough and congestion.  Make sure you are staying hydrated and getting some rest.  Return here if symptoms persist or worsen.     ED Prescriptions     Medication Sig Dispense Auth. Provider   azithromycin (ZITHROMAX Z-PAK) 250 MG tablet Take 2 pills (500mg ) first day and one pill (250mg ) the remaining 4 days. 6 tablet Wynonia Lawman A, NP   benzonatate (TESSALON) 100 MG capsule Take 1 capsule (100 mg total) by mouth every 8 (eight) hours. 21 capsule Wynonia Lawman A, NP   promethazine-dextromethorphan (PROMETHAZINE-DM) 6.25-15 MG/5ML syrup Take 5 mLs by mouth at bedtime as needed for cough. 118 mL Wynonia Lawman A, NP      PDMP not reviewed this encounter.   Wynonia Lawman A, NP 06/18/23 1051

## 2023-06-18 NOTE — Discharge Instructions (Addendum)
 Start taking azithromycin by taking 2 tablets today and 1 tablet on the remaining 4 days.  I have prescribed Tessalon that you can take every 8 hours as needed for cough.  I have also prescribed promethazine DM cough syrup that you can take at night for cough.  This can make you drowsy so do not drive, drink, or work while taking.    Otherwise alternate between Tylenol and ibuprofen as needed for pain and fever.  You can also try taking Imodium as needed for diarrhea.  I also recommend taking Mucinex to help with cough and congestion.  Make sure you are staying hydrated and getting some rest.  Return here if symptoms persist or worsen.

## 2023-06-18 NOTE — ED Triage Notes (Signed)
 Headache,cough,chills,fever x 1 week. Patient is taking OTC with no relief.
